# Patient Record
Sex: Female | Born: 1935 | Race: White | Hispanic: No | Marital: Married | Smoking: Never smoker
Health system: Southern US, Community
[De-identification: ages and names within clinical notes are randomized; demographics above are authoritative.]

## PROBLEM LIST (undated history)

## (undated) DIAGNOSIS — I4891 Unspecified atrial fibrillation: Secondary | ICD-10-CM

---

## 2005-12-03 ENCOUNTER — Other Ambulatory Visit: Payer: Self-pay

## 2005-12-03 ENCOUNTER — Emergency Department: Payer: Self-pay | Admitting: Emergency Medicine

## 2006-08-13 ENCOUNTER — Other Ambulatory Visit: Payer: Self-pay

## 2006-08-13 ENCOUNTER — Ambulatory Visit: Payer: Self-pay | Admitting: Podiatry

## 2006-08-17 ENCOUNTER — Ambulatory Visit: Payer: Self-pay | Admitting: Podiatry

## 2006-09-12 ENCOUNTER — Ambulatory Visit: Payer: Self-pay | Admitting: Nephrology

## 2007-05-06 ENCOUNTER — Ambulatory Visit: Payer: Self-pay | Admitting: Gastroenterology

## 2007-06-20 ENCOUNTER — Ambulatory Visit: Payer: Self-pay | Admitting: Cardiology

## 2007-12-21 ENCOUNTER — Ambulatory Visit: Payer: Self-pay | Admitting: Unknown Physician Specialty

## 2007-12-24 ENCOUNTER — Emergency Department: Payer: Self-pay | Admitting: Emergency Medicine

## 2008-01-19 ENCOUNTER — Emergency Department: Payer: Self-pay | Admitting: Emergency Medicine

## 2008-02-18 ENCOUNTER — Ambulatory Visit: Payer: Self-pay | Admitting: Pain Medicine

## 2008-02-26 ENCOUNTER — Ambulatory Visit: Payer: Self-pay | Admitting: Pain Medicine

## 2008-04-08 ENCOUNTER — Ambulatory Visit: Payer: Self-pay | Admitting: Internal Medicine

## 2008-04-21 ENCOUNTER — Ambulatory Visit: Payer: Self-pay | Admitting: Pain Medicine

## 2008-04-29 ENCOUNTER — Ambulatory Visit: Payer: Self-pay | Admitting: Pain Medicine

## 2008-06-02 ENCOUNTER — Ambulatory Visit: Payer: Self-pay | Admitting: Pain Medicine

## 2008-06-10 ENCOUNTER — Ambulatory Visit: Payer: Self-pay | Admitting: Pain Medicine

## 2008-07-10 ENCOUNTER — Emergency Department: Payer: Self-pay | Admitting: Emergency Medicine

## 2008-07-16 ENCOUNTER — Ambulatory Visit: Payer: Self-pay | Admitting: Pain Medicine

## 2008-07-29 ENCOUNTER — Ambulatory Visit: Payer: Self-pay | Admitting: Pain Medicine

## 2008-08-25 ENCOUNTER — Ambulatory Visit: Payer: Self-pay | Admitting: Pain Medicine

## 2008-10-15 ENCOUNTER — Ambulatory Visit: Payer: Self-pay | Admitting: Unknown Physician Specialty

## 2008-10-30 ENCOUNTER — Ambulatory Visit: Payer: Self-pay | Admitting: Unknown Physician Specialty

## 2009-09-02 IMAGING — CR DG THORACIC SPINE 2-3V
1 series · 2 of 2 positions shown · non-contrast
Comparison: none

REASON FOR EXAM: Pt fell tonight and injured mid back
COMMENTS:

[Series 1: view not recorded · 0.17mm/px · 2 of 2 slices shown]
[im 1/2]
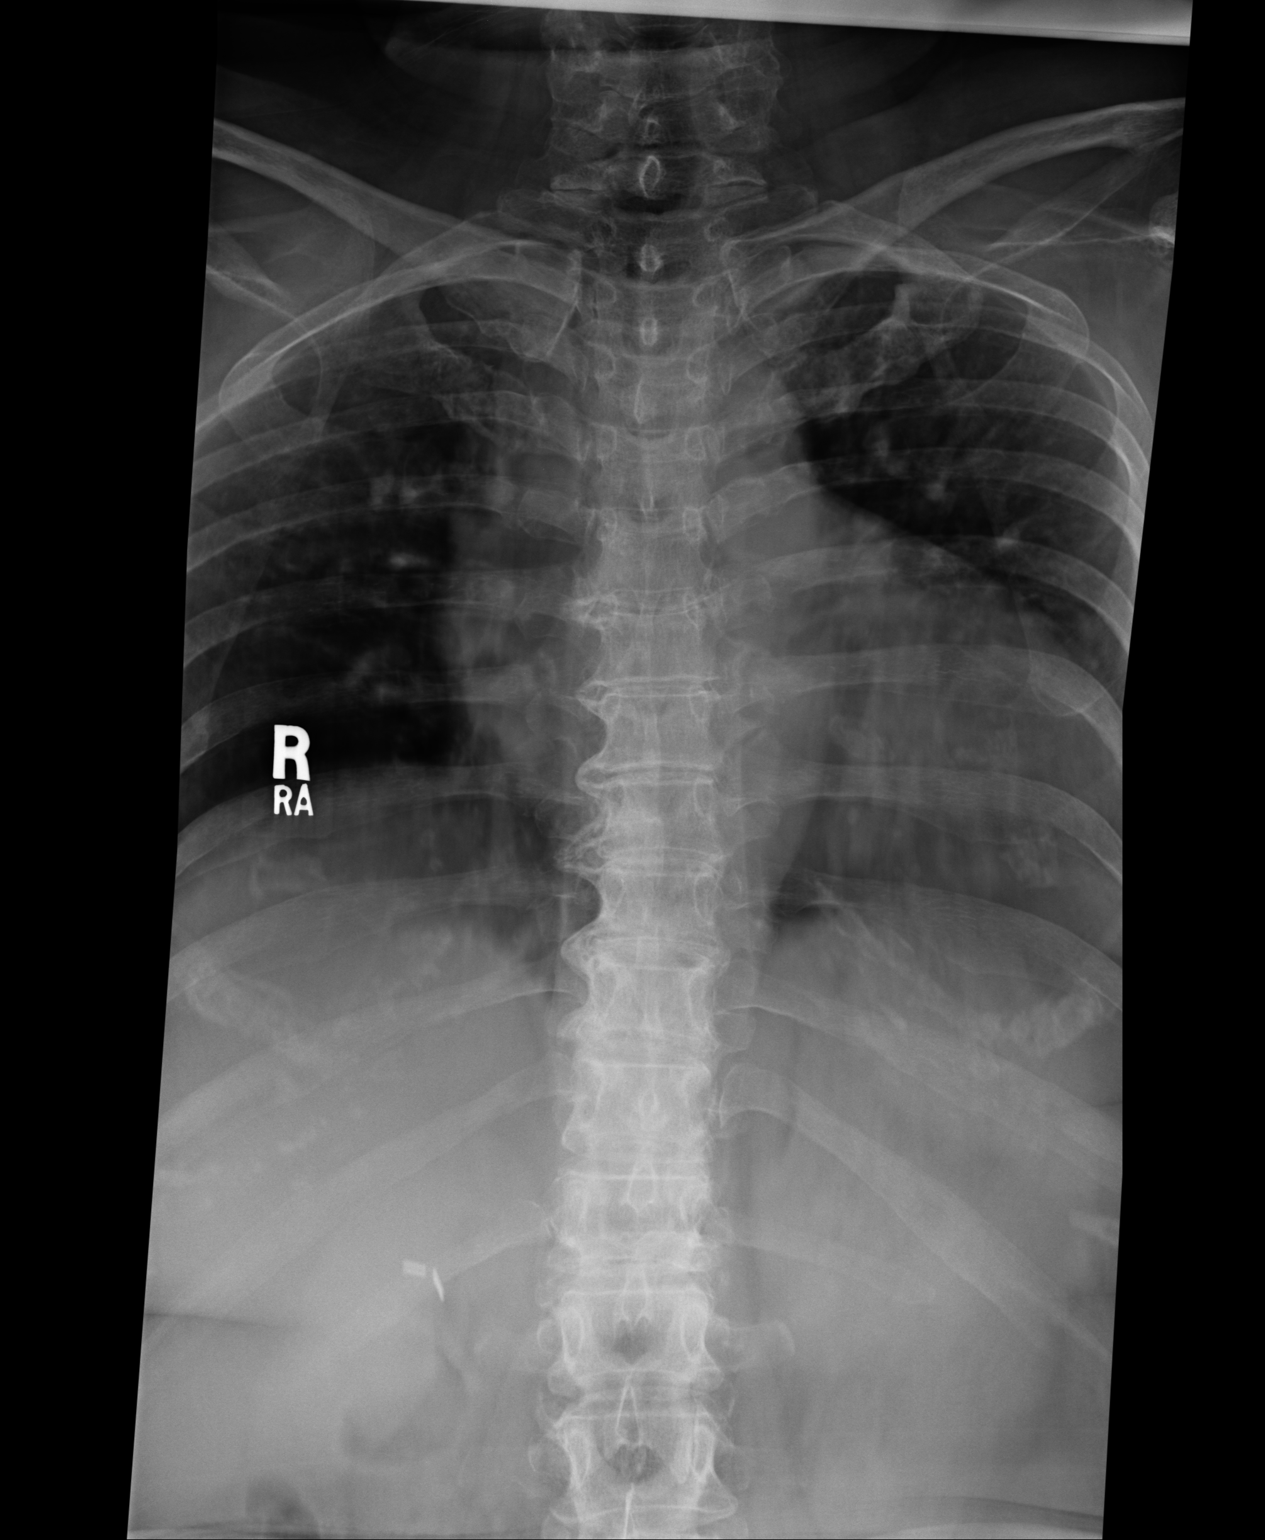
[im 2/2]
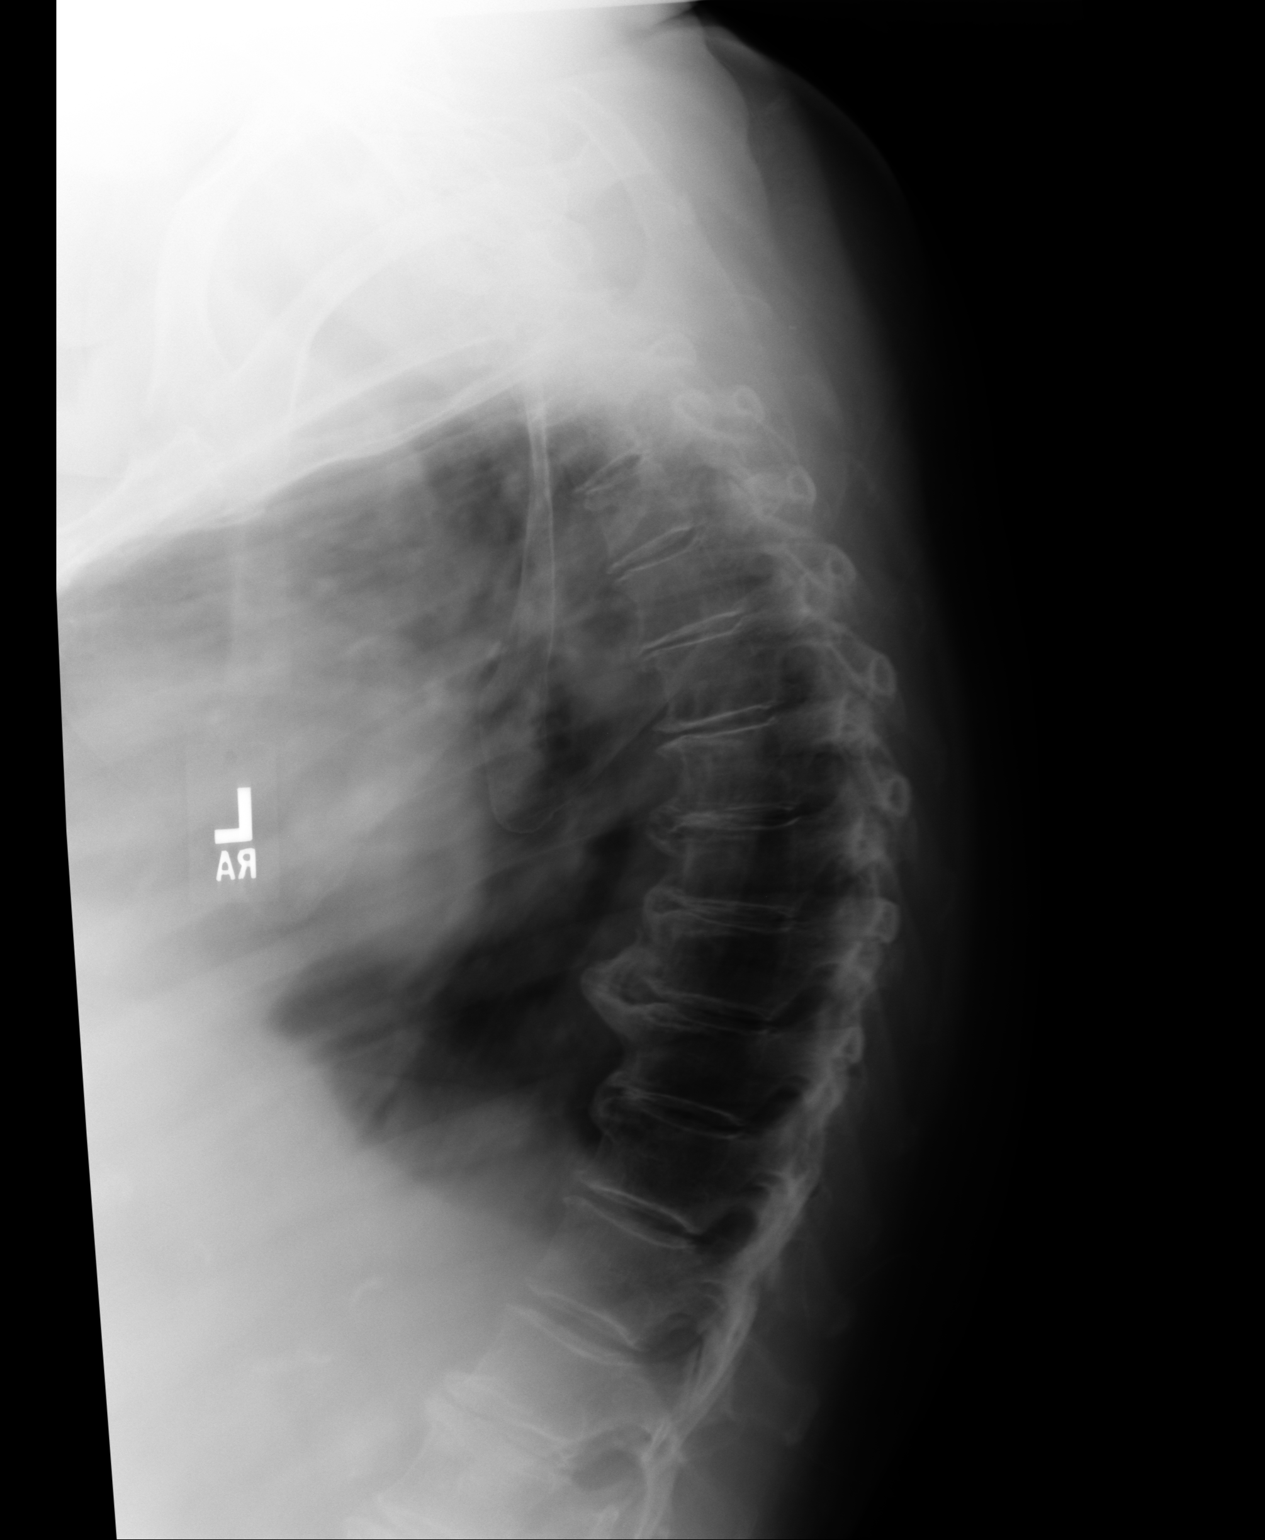

[2 of 2 positions shown; findings below may reference images not displayed]

PROCEDURE:     DXR - DXR THORACIC  AP AND LATERAL  - December 24, 2007 [DATE]

RESULT:     The vertebral body heights and the intervertebral disc spaces
are well maintained. No fracture is seen. Vertebral body alignment is
normal. Degenerative spurring is noted at multiples of the midthoracic
spine. The pedicles are bilateral intact.
IMPRESSION: 1. No acute changes are identified.
2. Degenerative spurring is present at multiple levels of the midthoracic
spine.

## 2009-10-31 ENCOUNTER — Emergency Department: Payer: Self-pay | Admitting: Emergency Medicine

## 2010-02-08 ENCOUNTER — Ambulatory Visit: Payer: Self-pay | Admitting: Internal Medicine

## 2010-03-08 ENCOUNTER — Ambulatory Visit: Payer: Self-pay | Admitting: Internal Medicine

## 2010-06-02 ENCOUNTER — Inpatient Hospital Stay: Payer: Self-pay | Admitting: Cardiology

## 2011-01-20 ENCOUNTER — Ambulatory Visit: Payer: Self-pay | Admitting: Gastroenterology

## 2011-01-23 LAB — PATHOLOGY REPORT

## 2011-12-13 ENCOUNTER — Ambulatory Visit: Payer: Self-pay | Admitting: Cardiology

## 2012-02-20 ENCOUNTER — Ambulatory Visit: Payer: Self-pay | Admitting: Orthopedic Surgery

## 2012-03-19 ENCOUNTER — Ambulatory Visit: Payer: Self-pay | Admitting: Orthopedic Surgery

## 2012-03-19 LAB — CBC
HCT: 42.3 % (ref 35.0–47.0)
MCH: 33.4 pg (ref 26.0–34.0)
MCHC: 34.3 g/dL (ref 32.0–36.0)
MCV: 98 fL (ref 80–100)
Platelet: 193 10*3/uL (ref 150–440)
RBC: 4.34 10*6/uL (ref 3.80–5.20)

## 2012-03-19 LAB — BASIC METABOLIC PANEL
Anion Gap: 9 (ref 7–16)
Chloride: 101 mmol/L (ref 98–107)
Co2: 31 mmol/L (ref 21–32)
Creatinine: 0.93 mg/dL (ref 0.60–1.30)
Glucose: 149 mg/dL — ABNORMAL HIGH (ref 65–99)
Sodium: 141 mmol/L (ref 136–145)

## 2012-03-28 ENCOUNTER — Inpatient Hospital Stay: Payer: Self-pay

## 2012-03-29 LAB — BASIC METABOLIC PANEL
Chloride: 100 mmol/L (ref 98–107)
Co2: 30 mmol/L (ref 21–32)
EGFR (African American): >60
EGFR (Non-African Amer.): >60
Potassium: 3.9 mmol/L (ref 3.5–5.1)
Sodium: 137 mmol/L (ref 136–145)

## 2012-03-29 LAB — PLATELET COUNT: Platelet: 174 10*3/uL (ref 150–440)

## 2012-03-30 LAB — CBC WITH DIFFERENTIAL/PLATELET
HCT: 42.3 % (ref 35.0–47.0)
Lymphocyte #: 2 10*3/uL (ref 1.0–3.6)
Lymphocyte %: 16.1 %
MCH: 33.1 pg (ref 26.0–34.0)
MCHC: 34.5 g/dL (ref 32.0–36.0)
Monocyte %: 11.7 %
Neutrophil #: 8.9 10*3/uL — ABNORMAL HIGH (ref 1.4–6.5)
RBC: 4.41 10*6/uL (ref 3.80–5.20)
RDW: 13 % (ref 11.5–14.5)
WBC: 12.4 10*3/uL — ABNORMAL HIGH (ref 3.6–11.0)

## 2012-03-30 LAB — COMPREHENSIVE METABOLIC PANEL
Albumin: 2.4 g/dL — ABNORMAL LOW (ref 3.4–5.0)
Anion Gap: 9 (ref 7–16)
BUN: 12 mg/dL (ref 7–18)
Bilirubin,Total: 0.5 mg/dL (ref 0.2–1.0)
Calcium, Total: 8.2 mg/dL — ABNORMAL LOW (ref 8.5–10.1)
Chloride: 96 mmol/L — ABNORMAL LOW (ref 98–107)
Creatinine: 0.71 mg/dL (ref 0.60–1.30)
Glucose: 169 mg/dL — ABNORMAL HIGH (ref 65–99)
SGPT (ALT): 29 U/L
Sodium: 133 mmol/L — ABNORMAL LOW (ref 136–145)

## 2012-03-30 LAB — PROTIME-INR: Prothrombin Time: 18.7 secs — ABNORMAL HIGH (ref 11.5–14.7)

## 2012-03-31 LAB — BASIC METABOLIC PANEL
Calcium, Total: 8.6 mg/dL (ref 8.5–10.1)
Chloride: 94 mmol/L — ABNORMAL LOW (ref 98–107)
Co2: 31 mmol/L (ref 21–32)
EGFR (African American): >60
EGFR (Non-African Amer.): >60
Potassium: 3.2 mmol/L — ABNORMAL LOW (ref 3.5–5.1)
Sodium: 133 mmol/L — ABNORMAL LOW (ref 136–145)

## 2012-03-31 LAB — CBC WITH DIFFERENTIAL/PLATELET
Basophil #: 0.1 10*3/uL (ref 0.0–0.1)
Basophil %: 0.4 %
Eosinophil #: 0.1 10*3/uL (ref 0.0–0.7)
Lymphocyte #: 1.8 10*3/uL (ref 1.0–3.6)
MCV: 95 fL (ref 80–100)
Monocyte #: 1.3 x10 3/mm — ABNORMAL HIGH (ref 0.2–0.9)
Monocyte %: 10.6 %
Neutrophil #: 9.3 10*3/uL — ABNORMAL HIGH (ref 1.4–6.5)
Neutrophil %: 73.8 %
RBC: 4.4 10*6/uL (ref 3.80–5.20)

## 2012-04-01 LAB — BASIC METABOLIC PANEL
BUN: 14 mg/dL (ref 7–18)
Calcium, Total: 8.2 mg/dL — ABNORMAL LOW (ref 8.5–10.1)
EGFR (Non-African Amer.): >60

## 2012-04-02 ENCOUNTER — Encounter: Payer: Self-pay | Admitting: Internal Medicine

## 2012-04-02 LAB — BASIC METABOLIC PANEL
Anion Gap: 9 (ref 7–16)
BUN: 15 mg/dL (ref 7–18)
Calcium, Total: 8.6 mg/dL (ref 8.5–10.1)
Chloride: 100 mmol/L (ref 98–107)
Co2: 27 mmol/L (ref 21–32)
Creatinine: 0.81 mg/dL (ref 0.60–1.30)
EGFR (African American): >60
Osmolality: 276 (ref 275–301)

## 2012-04-02 LAB — PROTIME-INR: INR: 2.7

## 2012-04-04 LAB — PROTIME-INR
INR: 1.9
Prothrombin Time: 21.8 secs — ABNORMAL HIGH (ref 11.5–14.7)

## 2012-04-11 LAB — PROTIME-INR
INR: 1.9
Prothrombin Time: 22.2 secs — ABNORMAL HIGH (ref 11.5–14.7)

## 2012-04-13 ENCOUNTER — Encounter: Payer: Self-pay | Admitting: Internal Medicine

## 2012-04-16 LAB — PROTIME-INR: INR: 1.9

## 2012-07-10 ENCOUNTER — Ambulatory Visit (INDEPENDENT_AMBULATORY_CARE_PROVIDER_SITE_OTHER): Payer: BC Managed Care – PPO | Admitting: Sports Medicine

## 2012-07-10 ENCOUNTER — Other Ambulatory Visit: Payer: Self-pay | Admitting: Radiology

## 2012-07-10 ENCOUNTER — Encounter: Payer: Self-pay | Admitting: Sports Medicine

## 2012-07-10 VITALS — BP 116/69 | HR 66 | Ht 60.5 in | Wt 225.0 lb

## 2012-07-10 DIAGNOSIS — M7120 Synovial cyst of popliteal space [Baker], unspecified knee: Secondary | ICD-10-CM

## 2012-07-10 NOTE — Progress Notes (Signed)
Patient is a very pleasant 78 year old female who is here 3 months status post left total knee replacement to be evaluated for ongoing Baker's cyst. Patient was seen previously and had her surgery done by Dr. Rosita Kea of [facility]. Patient states that the surgery went very well and has been doing the physical therapy which she has made great progress. 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 Patient states that this Baker's cyst seems to be painful and hurts her with flexion and she says this is inhibiting her rehabilitation. Patient was sent here for evaluation for potential aspiration. Patient denies any locking or popping of the knee, denies any numbness of the extremity, denies any fevers or chills or any redness out of the ordinary of the knee.  Current outpatient prescriptions:acetaminophen (TYLENOL) 500 MG tablet, Take 500 mg by mouth every 6 (six) hours as needed., Disp: , Rfl: ;  allopurinol (ZYLOPRIM) 100 MG tablet, Take 100 mg by mouth daily., Disp: , Rfl: ;  amLODipine (NORVASC) 10 MG tablet, Take 5 mg by mouth daily., Disp: , Rfl: ;  Calcium Carbonate-Vitamin D (CALCIUM + D PO), Take by mouth., Disp: , Rfl:  cholecalciferol (VITAMIN D) 1000 UNITS tablet, Take 1,000 Units by mouth daily., Disp: , Rfl: ;  cloNIDine (CATAPRES) 0.2 MG tablet, Take 0.2 mg by mouth 2 (two) times daily., Disp: , Rfl: ;  fenofibrate 160 MG tablet, Take 160 mg by mouth daily., Disp: , Rfl: ;  fish oil-omega-3 fatty acids 1000 MG capsule, Take 2 g by mouth daily., Disp: , Rfl: ;  hydrochlorothiazide (HYDRODIURIL) 25 MG tablet, Take 25 mg by mouth daily., Disp: , Rfl:  losartan-hydrochlorothiazide (HYZAAR) 100-12.5 MG per tablet, Take 1 tablet by mouth daily., Disp: , Rfl: ;  Melatonin 5 MG TABS, Take 1 tablet by mouth daily., Disp: , Rfl: ;  metFORMIN (GLUCOPHAGE) 500 MG tablet, Take 500 mg by mouth 2 (two) times  daily., Disp: , Rfl: ;  Multiple Vitamin (MULTIVITAMIN) tablet, Take 1 tablet by mouth daily., Disp: , Rfl:  pantoprazole (PROTONIX) 40 MG tablet, Take 40 mg by mouth 2 (two) times daily., Disp: , Rfl: ;  simvastatin (ZOCOR) 40 MG tablet, Take 40 mg by mouth daily., Disp: , Rfl: ;  sotalol (BETAPACE) 80 MG tablet, Take 80 mg by mouth 2 (two) times daily., Disp: , Rfl: ;  venlafaxine XR (EFFEXOR-XR) 75 MG 24 hr capsule, Take 75 mg by mouth daily., Disp: , Rfl: ;  vitamin B-12 (CYANOCOBALAMIN) 1000 MCG tablet, Take 1,000 mcg by mouth daily., Disp: , Rfl:   History   Social History  . Marital Status: Married    Spouse Name: N/A    Number of Children: N/A  . Years of Education: N/A   Occupational History  . Not on file.   Social History Main Topics  . Smoking status: Never Smoker   . Smokeless tobacco: Never Used  . Alcohol Use: Not on file  . Drug Use: Not on file  . Sexually Active: Not on file   Other Topics Concern  . Not on file   Social History Narrative  . No narrative on file   Allergies  Allergen Reactions  . Amitriptyline   . Celebrex (Celecoxib)   . Codeine Nausea Only  . Colchicine Diarrhea  . Sulfa Antibiotics Nausea Only  . Vioxx (Rofecoxib) Nausea Only   Physical exam Filed Vitals:  07/10/12 1008  BP: 116/69  Pulse: 66   General: no apparent distress very pleasant elderly woman Alert and oriented x3 patient's mood and affect are normal Respiratory: Patient is able to speak in full sentences and does not appear short of breath Skin: Clean dry and intact no signs of erythema or cellulitis. Left knee exam: On inspection patient's incision appears to be well healing on the anterior aspect of the knee. Patient has full extension in the 100 of flexion. Patient has some pain in the posterior aspect of the knee with 90 of flexion. Patient has no joint line tenderness. When palpating posteriorly patient does have a Baker's cyst that is palpated approximately 1-2 cm  in diameter, minor fluctuation. The location of this is a little bit more lateral than most Baker's cyst. Neurovascularly intact distally. No edema distally.  Ultrasound was done and interpreted by me today. On limited knee imaging concentrating on the posterior aspect of knee patient does have a Baker cyst that measures less than one centimeters in diameter mostly just lateral to the popliteal fossa superior to the gastroc head. Patient has overlying popliteal vessels that are in close proximity to the cyst itself.

## 2012-07-10 NOTE — Patient Instructions (Addendum)
Very nice to meet you both We will get this schedule with interventional radiology for aspiration of your Baker cyst.  They will call you and get the procedure scheduled.  Good luck with anything and if you have any questions or concerns please give [place] a call.

## 2012-07-10 NOTE — Assessment & Plan Note (Addendum)
Patient does have a Baker's cyst on the posterior aspect of her knee that likely is causing some of the pain with her flexion. Due to the close proximity of the great vessels in this area did not feel comfortable doing an aspiration of this cyst. We did discuss care of patient with her primary orthopedic physician Dr. Rosita Kea and he felt comfortable that if interventional radiology could aspirate this Baker's cyst that that would be fine, even being 3 months out from TKR. At this time we have referred her to interventional radiology where she will have ultrasound guided aspiration of her Baker's cyst if they feel comfortable after evaluation. Patient has an appointment set up for this Friday at 12:45 PM. Interventional radiology will be calling her with this information. Patient then can either followup with [place] or with Dr. Rosita Kea for any more for problems.

## 2012-07-12 ENCOUNTER — Other Ambulatory Visit: Payer: Self-pay | Admitting: Family Medicine

## 2012-07-12 ENCOUNTER — Ambulatory Visit (HOSPITAL_COMMUNITY)
Admission: RE | Admit: 2012-07-12 | Discharge: 2012-07-12 | Disposition: A | Discharge status: Home / Self Care | Payer: BC Managed Care – PPO | Source: Ambulatory Visit | Attending: Sports Medicine | Admitting: Sports Medicine

## 2012-07-12 DIAGNOSIS — M7120 Synovial cyst of popliteal space [Baker], unspecified knee: Secondary | ICD-10-CM

## 2012-07-12 DIAGNOSIS — M25569 Pain in unspecified knee: Secondary | ICD-10-CM | POA: Insufficient documentation

## 2013-03-07 ENCOUNTER — Ambulatory Visit: Payer: Self-pay | Admitting: Internal Medicine

## 2013-06-26 ENCOUNTER — Inpatient Hospital Stay (HOSPITAL_COMMUNITY): Payer: Medicare Other

## 2013-06-26 ENCOUNTER — Ambulatory Visit: Admit: 2013-06-26 | Payer: Self-pay | Admitting: Interventional Radiology

## 2013-06-26 ENCOUNTER — Inpatient Hospital Stay (HOSPITAL_COMMUNITY): Payer: Medicare Other | Admitting: Anesthesiology

## 2013-06-26 ENCOUNTER — Encounter (HOSPITAL_COMMUNITY): Payer: Self-pay | Admitting: Neurology

## 2013-06-26 ENCOUNTER — Encounter (HOSPITAL_COMMUNITY): Payer: Self-pay | Admitting: Anesthesiology

## 2013-06-26 ENCOUNTER — Emergency Department (HOSPITAL_COMMUNITY): Payer: Medicare Other

## 2013-06-26 ENCOUNTER — Encounter (HOSPITAL_COMMUNITY): Admission: EM | Disposition: E | Payer: Self-pay | Source: Home / Self Care | Attending: Neurology

## 2013-06-26 ENCOUNTER — Inpatient Hospital Stay (HOSPITAL_COMMUNITY)
Admission: EM | Admit: 2013-06-26 | Discharge: 2013-07-14 | DRG: 023 | Disposition: E | Payer: Medicare Other | Attending: Neurology | Admitting: Neurology

## 2013-06-26 DIAGNOSIS — G8190 Hemiplegia, unspecified affecting unspecified side: Secondary | ICD-10-CM | POA: Diagnosis present

## 2013-06-26 DIAGNOSIS — R4182 Altered mental status, unspecified: Secondary | ICD-10-CM | POA: Diagnosis present

## 2013-06-26 DIAGNOSIS — I6350 Cerebral infarction due to unspecified occlusion or stenosis of unspecified cerebral artery: Secondary | ICD-10-CM

## 2013-06-26 DIAGNOSIS — I639 Cerebral infarction, unspecified: Secondary | ICD-10-CM

## 2013-06-26 DIAGNOSIS — I63511 Cerebral infarction due to unspecified occlusion or stenosis of right middle cerebral artery: Secondary | ICD-10-CM | POA: Diagnosis present

## 2013-06-26 DIAGNOSIS — Z7901 Long term (current) use of anticoagulants: Secondary | ICD-10-CM

## 2013-06-26 DIAGNOSIS — I10 Essential (primary) hypertension: Secondary | ICD-10-CM | POA: Diagnosis present

## 2013-06-26 DIAGNOSIS — Z882 Allergy status to sulfonamides status: Secondary | ICD-10-CM

## 2013-06-26 DIAGNOSIS — E876 Hypokalemia: Secondary | ICD-10-CM | POA: Diagnosis present

## 2013-06-26 DIAGNOSIS — I6529 Occlusion and stenosis of unspecified carotid artery: Secondary | ICD-10-CM | POA: Diagnosis present

## 2013-06-26 DIAGNOSIS — E669 Obesity, unspecified: Secondary | ICD-10-CM | POA: Diagnosis present

## 2013-06-26 DIAGNOSIS — G936 Cerebral edema: Secondary | ICD-10-CM | POA: Diagnosis not present

## 2013-06-26 DIAGNOSIS — Z515 Encounter for palliative care: Secondary | ICD-10-CM

## 2013-06-26 DIAGNOSIS — J9600 Acute respiratory failure, unspecified whether with hypoxia or hypercapnia: Secondary | ICD-10-CM | POA: Diagnosis not present

## 2013-06-26 DIAGNOSIS — I619 Nontraumatic intracerebral hemorrhage, unspecified: Secondary | ICD-10-CM | POA: Diagnosis not present

## 2013-06-26 DIAGNOSIS — I4891 Unspecified atrial fibrillation: Secondary | ICD-10-CM | POA: Diagnosis present

## 2013-06-26 DIAGNOSIS — I6340 Cerebral infarction due to embolism of unspecified cerebral artery: Principal | ICD-10-CM | POA: Diagnosis present

## 2013-06-26 DIAGNOSIS — Z66 Do not resuscitate: Secondary | ICD-10-CM | POA: Diagnosis present

## 2013-06-26 DIAGNOSIS — R4020 Unspecified coma: Secondary | ICD-10-CM | POA: Diagnosis present

## 2013-06-26 DIAGNOSIS — G935 Compression of brain: Secondary | ICD-10-CM | POA: Diagnosis not present

## 2013-06-26 DIAGNOSIS — E785 Hyperlipidemia, unspecified: Secondary | ICD-10-CM | POA: Diagnosis present

## 2013-06-26 HISTORY — DX: Unspecified atrial fibrillation: I48.91

## 2013-06-26 HISTORY — PX: RADIOLOGY WITH ANESTHESIA: SHX6223

## 2013-06-26 LAB — POCT I-STAT 3, ART BLOOD GAS (G3+)
Bicarbonate: 26.6 mEq/L — ABNORMAL HIGH (ref 20.0–24.0)
Patient temperature: 36.5
pH, Arterial: 7.321 — ABNORMAL LOW (ref 7.350–7.450)

## 2013-06-26 LAB — URINALYSIS, ROUTINE W REFLEX MICROSCOPIC
Glucose, UA: NEGATIVE mg/dL
Protein, ur: >300 mg/dL — AB

## 2013-06-26 LAB — CBC
HCT: 44.2 % (ref 36.0–46.0)
Hemoglobin: 15.4 g/dL — ABNORMAL HIGH (ref 12.0–15.0)
MCH: 33.5 pg (ref 26.0–34.0)
MCHC: 34.8 g/dL (ref 30.0–36.0)
MCV: 96.1 fL (ref 78.0–100.0)

## 2013-06-26 LAB — ETHANOL: Alcohol, Ethyl (B): <11 mg/dL (ref 0–11)

## 2013-06-26 LAB — URINE MICROSCOPIC-ADD ON

## 2013-06-26 LAB — COMPREHENSIVE METABOLIC PANEL
Albumin: 3.3 g/dL — ABNORMAL LOW (ref 3.5–5.2)
BUN: 29 mg/dL — ABNORMAL HIGH (ref 6–23)
Calcium: 10 mg/dL (ref 8.4–10.5)
Chloride: 98 mEq/L (ref 96–112)
Creatinine, Ser: 0.8 mg/dL (ref 0.50–1.10)
Total Bilirubin: 0.2 mg/dL — ABNORMAL LOW (ref 0.3–1.2)
Total Protein: 7.2 g/dL (ref 6.0–8.3)

## 2013-06-26 LAB — RAPID URINE DRUG SCREEN, HOSP PERFORMED
Amphetamines: NOT DETECTED
Opiates: NOT DETECTED

## 2013-06-26 LAB — ABO/RH: ABO/RH(D): AB POS

## 2013-06-26 LAB — POCT ACTIVATED CLOTTING TIME
Activated Clotting Time: 160 seconds
Activated Clotting Time: 211 seconds

## 2013-06-26 LAB — POCT I-STAT, CHEM 8
BUN: 29 mg/dL — ABNORMAL HIGH (ref 6–23)
Creatinine, Ser: 1 mg/dL (ref 0.50–1.10)
Potassium: 3.7 mEq/L (ref 3.5–5.1)
Sodium: 141 mEq/L (ref 135–145)

## 2013-06-26 LAB — TROPONIN I: Troponin I: <0.3 ng/mL (ref <0.30)

## 2013-06-26 LAB — POCT I-STAT TROPONIN I

## 2013-06-26 LAB — DIFFERENTIAL
Basophils Relative: 0 % (ref 0–1)
Eosinophils Absolute: 0.2 10*3/uL (ref 0.0–0.7)
Eosinophils Relative: 2 % (ref 0–5)
Monocytes Absolute: 0.6 10*3/uL (ref 0.1–1.0)
Monocytes Relative: 6 % (ref 3–12)

## 2013-06-26 LAB — SODIUM: Sodium: 140 mEq/L (ref 135–145)

## 2013-06-26 LAB — MRSA PCR SCREENING: MRSA by PCR: NEGATIVE

## 2013-06-26 SURGERY — RADIOLOGY WITH ANESTHESIA
Anesthesia: General

## 2013-06-26 MED ORDER — NICARDIPINE HCL IN NACL 20-0.86 MG/200ML-% IV SOLN
5.0000 mg/h | INTRAVENOUS | Status: AC
  Administered 2013-06-26 (×2): 15 mg/h via INTRAVENOUS
  Administered 2013-06-26: 5 mg/h via INTRAVENOUS
  Administered 2013-06-26 (×3): 15 mg/h via INTRAVENOUS
  Administered 2013-06-27: 10 mg/h via INTRAVENOUS
  Administered 2013-06-27 (×5): 15 mg/h via INTRAVENOUS
  Filled 2013-06-26 (×13): qty 200

## 2013-06-26 MED ORDER — LIDOCAINE HCL (CARDIAC) 20 MG/ML IV SOLN
INTRAVENOUS | Status: DC | PRN
  Administered 2013-06-26: 80 mg via INTRAVENOUS

## 2013-06-26 MED ORDER — IOHEXOL 300 MG/ML  SOLN
150.0000 mL | Freq: Once | INTRAMUSCULAR | Status: AC | PRN
  Administered 2013-06-26: 130 mL via INTRA_ARTERIAL

## 2013-06-26 MED ORDER — SODIUM CHLORIDE 0.9 % IV SOLN
10.0000 mg | INTRAVENOUS | Status: DC | PRN
  Administered 2013-06-26: 25 ug/min via INTRAVENOUS

## 2013-06-26 MED ORDER — ROCURONIUM BROMIDE 100 MG/10ML IV SOLN
INTRAVENOUS | Status: DC | PRN
  Administered 2013-06-26: 50 mg via INTRAVENOUS

## 2013-06-26 MED ORDER — ROCURONIUM BROMIDE 50 MG/5ML IV SOLN
INTRAVENOUS | Status: AC
  Filled 2013-06-26: qty 2

## 2013-06-26 MED ORDER — CHLORHEXIDINE GLUCONATE 0.12 % MT SOLN
15.0000 mL | Freq: Two times a day (BID) | OROMUCOSAL | Status: DC
  Administered 2013-06-27: 15 mL via OROMUCOSAL

## 2013-06-26 MED ORDER — LABETALOL HCL 5 MG/ML IV SOLN
10.0000 mg | INTRAVENOUS | Status: DC | PRN
  Administered 2013-06-26 – 2013-06-27 (×2): 10 mg via INTRAVENOUS
  Filled 2013-06-26: qty 4

## 2013-06-26 MED ORDER — CEFAZOLIN SODIUM-DEXTROSE 2-3 GM-% IV SOLR
INTRAVENOUS | Status: AC
  Filled 2013-06-26: qty 50

## 2013-06-26 MED ORDER — SUCCINYLCHOLINE CHLORIDE 20 MG/ML IJ SOLN
INTRAMUSCULAR | Status: DC | PRN
  Administered 2013-06-26: 100 mg via INTRAVENOUS

## 2013-06-26 MED ORDER — SODIUM CHLORIDE 0.9 % IV SOLN
INTRAVENOUS | Status: DC | PRN
  Administered 2013-06-26: 12:00:00 via INTRAVENOUS

## 2013-06-26 MED ORDER — MIDAZOLAM HCL 2 MG/2ML IJ SOLN
INTRAMUSCULAR | Status: AC
  Filled 2013-06-26: qty 6

## 2013-06-26 MED ORDER — PROPOFOL 10 MG/ML IV BOLUS
INTRAVENOUS | Status: DC | PRN
  Administered 2013-06-26: 110 mg via INTRAVENOUS

## 2013-06-26 MED ORDER — ETOMIDATE 2 MG/ML IV SOLN
INTRAVENOUS | Status: AC
  Filled 2013-06-26: qty 20

## 2013-06-26 MED ORDER — ACETAMINOPHEN 500 MG PO TABS: 1000.0000 mg | ORAL_TABLET | Freq: Four times a day (QID) | ORAL | Status: DC | PRN

## 2013-06-26 MED ORDER — SENNOSIDES-DOCUSATE SODIUM 8.6-50 MG PO TABS
1.0000 | ORAL_TABLET | Freq: Every evening | ORAL | Status: DC | PRN
Start: 2013-06-26 — End: 2013-06-27

## 2013-06-26 MED ORDER — ACETAMINOPHEN 650 MG RE SUPP: 650.0000 mg | Freq: Four times a day (QID) | RECTAL | Status: DC | PRN

## 2013-06-26 MED ORDER — LIDOCAINE HCL (CARDIAC) 20 MG/ML IV SOLN
INTRAVENOUS | Status: AC
  Filled 2013-06-26: qty 5

## 2013-06-26 MED ORDER — HEPARIN SODIUM (PORCINE) 1000 UNIT/ML IJ SOLN
INTRAMUSCULAR | Status: DC | PRN
  Administered 2013-06-26 (×2): 1000 [IU] via INTRAVENOUS

## 2013-06-26 MED ORDER — ONDANSETRON HCL 4 MG/2ML IJ SOLN
4.0000 mg | Freq: Four times a day (QID) | INTRAMUSCULAR | Status: DC | PRN
  Administered 2013-06-27: 4 mg via INTRAVENOUS
  Filled 2013-06-26: qty 2

## 2013-06-26 MED ORDER — FENTANYL CITRATE 0.05 MG/ML IJ SOLN
INTRAMUSCULAR | Status: AC
  Filled 2013-06-26: qty 6

## 2013-06-26 MED ORDER — CEFAZOLIN SODIUM-DEXTROSE 2-3 GM-% IV SOLR
2.0000 g | Freq: Once | INTRAVENOUS | Status: AC
  Administered 2013-06-26: 2 g via INTRAVENOUS

## 2013-06-26 MED ORDER — PROTAMINE SULFATE 10 MG/ML IV SOLN
INTRAVENOUS | Status: DC | PRN
  Administered 2013-06-26: 10 mg via INTRAVENOUS

## 2013-06-26 MED ORDER — CHLORHEXIDINE GLUCONATE 0.12 % MT SOLN
OROMUCOSAL | Status: AC
  Administered 2013-06-26: 15 mL
  Filled 2013-06-26: qty 15

## 2013-06-26 MED ORDER — FENTANYL CITRATE 0.05 MG/ML IJ SOLN: 25.0000 ug | INTRAMUSCULAR | Status: DC | PRN

## 2013-06-26 MED ORDER — PROPOFOL 10 MG/ML IV EMUL
5.0000 ug/kg/min | INTRAVENOUS | Status: DC
  Administered 2013-06-26: 10 ug/kg/min via INTRAVENOUS
  Filled 2013-06-26: qty 100

## 2013-06-26 MED ORDER — FENTANYL CITRATE 0.05 MG/ML IJ SOLN
INTRAMUSCULAR | Status: DC | PRN
  Administered 2013-06-26: 50 ug via INTRAVENOUS
  Administered 2013-06-26: 100 ug via INTRAVENOUS
  Administered 2013-06-26: 250 ug via INTRAVENOUS
  Administered 2013-06-26: 100 ug via INTRAVENOUS

## 2013-06-26 MED ORDER — BIOTENE DRY MOUTH MT LIQD
15.0000 mL | Freq: Four times a day (QID) | OROMUCOSAL | Status: DC
  Administered 2013-06-26 – 2013-06-27 (×2): 15 mL via OROMUCOSAL

## 2013-06-26 MED ORDER — SODIUM CHLORIDE 3 % IV SOLN
INTRAVENOUS | Status: DC
  Administered 2013-06-26 – 2013-06-27 (×2): via INTRAVENOUS
  Filled 2013-06-26 (×6): qty 500

## 2013-06-26 MED ORDER — SODIUM CHLORIDE 0.9 % IV SOLN
INTRAVENOUS | Status: DC
  Administered 2013-06-26: 15:00:00 via INTRAVENOUS

## 2013-06-26 MED ORDER — NITROGLYCERIN 1 MG/10 ML FOR IR/CATH LAB
INTRA_ARTERIAL | Status: AC
  Filled 2013-06-26: qty 10

## 2013-06-26 MED ORDER — SUCCINYLCHOLINE CHLORIDE 20 MG/ML IJ SOLN
INTRAMUSCULAR | Status: AC
  Filled 2013-06-26: qty 1

## 2013-06-26 NOTE — Transfer of Care (Signed)
Immediate Anesthesia Transfer of Care Note  Patient: Tara [name]  Procedure(s) Performed: Procedure(s): RADIOLOGY WITH ANESTHESIA (N/A)  Patient Location: ICU  Anesthesia Type:General  Level of Consciousness: sedated and unresponsive  Airway & Oxygen Therapy: Patient remains intubated per anesthesia plan and Patient placed on Ventilator (see vital sign flow sheet for setting)  Post-op Assessment: Post -op Vital signs reviewed and stable  Post vital signs: Reviewed and stable  Complications: No apparent anesthesia complications

## 2013-06-26 NOTE — Procedures (Signed)
S/P RT common carotid arteriogram followed by by complete angiograhic recanalization of occluded RT ICA,RT MCA and RT ACA using 2 passes with the trevoprovue device.. Isolated rt cerebral cerebral   hemisphere  with no collaterals.

## 2013-06-26 NOTE — Progress Notes (Signed)
Right groin stick time was 1210 pm; revascularization time was 1255 pm

## 2013-06-26 NOTE — Progress Notes (Signed)
Responded to code stroke  page to escort pt. family to consultation room(A).  Pt reported to ED with altered mental status after being found unresponsive by daughter slumped over in chair. I remained with family until the physician debriefed them.  Pt. was later taken to interventional Radiology for procedure.  Pt. Is expected to be admitted to [facility]. I provided prayer, words of comfort, listening, ministry of presence , information sharing between staff and family.anticipatory grief counseling. Family moved to radiology waiting per doctors' request. Will follow as needed.  06/17/2013 1200  Clinical Encounter Type  Visited With Patient;Family;Health care provider  Visit Type Spiritual support;Patient in surgery;Critical Care;ED  Referral From Nurse  Spiritual Encounters  Spiritual Needs Prayer;Emotional  Stress Factors  Family Stress Factors Exhausted;Health changes;Major life changes  Tara [name], [address]

## 2013-06-26 NOTE — ED Notes (Signed)
Care transferred over to anesthesia

## 2013-06-26 NOTE — Anesthesia Preprocedure Evaluation (Addendum)
Anesthesia Evaluation  Patient identified by MRN, date of birth, ID band Patient unresponsive    Reviewed: Allergy & Precautions, H&P , Patient's Chart, lab work & pertinent test results  Airway  TM Distance: <3 FB    Comment: Unable to fully evaluate.  Pt is unresponsive. Dental   Pulmonary          Cardiovascular hypertension, + dysrhythmias Atrial Fibrillation     Neuro/Psych Pt is a code stroke.  Acute CVA CVA, Residual Symptoms    GI/Hepatic   Endo/Other  diabetes, Type 2  Renal/GU      Musculoskeletal   Abdominal   Peds  Hematology   Anesthesia Other Findings   Reproductive/Obstetrics                          Anesthesia Physical Anesthesia Plan  ASA: IV and emergent  Anesthesia Plan: General   Post-op Pain Management:    Induction: Intravenous  Airway Management Planned: Oral ETT  Additional Equipment:   Intra-op Plan:   Post-operative Plan: Post-operative intubation/ventilation  Informed Consent:   Plan Discussed with: CRNA, Anesthesiologist and Surgeon  Anesthesia Plan Comments:         Anesthesia Quick Evaluation

## 2013-06-26 NOTE — Progress Notes (Signed)
MD paged due to patients neuro changes, Pupils unequal Right pupil 5, Left pupil 3 and both non-reactive. Pt is withdrawing to pain in all extremities except the left upper. Orders received for stat head CT. Head Ct completed and MD called to look at results. Orders received for further treatment of central line placement and 3% saline to be started.

## 2013-06-26 NOTE — Anesthesia Postprocedure Evaluation (Signed)
  Anesthesia Post-op Note  Patient: Tara [name]  Procedure(s) Performed: Procedure(s): RADIOLOGY WITH ANESTHESIA (N/A)  Patient Location: ICU  Anesthesia Type:General  Level of Consciousness: sedated and unresponsive  Airway and Oxygen Therapy: Patient remains intubated per anesthesia plan and Patient placed on Ventilator (see vital sign flow sheet for setting)  Post-op Pain: none  Post-op Assessment: Post-op Vital signs reviewed, Patient's Cardiovascular Status Stable, Respiratory Function Stable and Patent Airway  Post-op Vital Signs: Reviewed and stable  Complications: No apparent anesthesia complications

## 2013-06-26 NOTE — ED Notes (Addendum)
Verbal order from julie-rn to place temp foley.  Sterile technique used throughout procedure.  Urine yellow in color.  Slightly cloudy.  Patient tolerated well.  Nothing abnormal to report.

## 2013-06-26 NOTE — Progress Notes (Signed)
At bedside rounds, it was discovered that Tara [name] pupils were unequal, L3 R4. Stroke MD paged. She also is now withdrawing on left and following commands on the rt, which is an improvement.

## 2013-06-26 NOTE — Procedures (Signed)
Central Venous Catheter Insertion Procedure Note Evanthia Maund [id number] 07-30-1932  Procedure: Insertion of Central Venous Catheter Indications: Assessment of intravascular volume and Drug and/or fluid administration  Procedure Details Consent: Risks of procedure as well as the alternatives and risks of each were explained to the (patient/caregiver).  Consent for procedure obtained. Time Out: Verified patient identification, verified procedure, site/side was marked, verified correct patient position, special equipment/implants available, medications/allergies/relevent history reviewed, required imaging and test results available.  Performed  Maximum sterile technique was used including antiseptics, cap, gloves, gown, hand hygiene, mask and sheet. Skin prep: Chlorhexidine; local anesthetic administered A antimicrobial bonded/coated triple lumen catheter was placed in the right internal jugular vein using the Seldinger technique.  Ultrasound was used to verify the patency of the vein and for real time needle guidance.  Evaluation Blood flow good Complications: No apparent complications Patient did tolerate procedure well. Chest X-ray ordered to verify placement.  CXR: pending.  Luciano Cinquemani 06/13/2013, 10:26 PM

## 2013-06-26 NOTE — Consult Note (Signed)
PULMONARY  / CRITICAL CARE MEDICINE  Name: Tara [name] MRN: [id number] DOB: 09-17-1932    ADMISSION DATE:  06/30/2013 CONSULTATION DATE: 07/09/2013  REFERRING MD :   PRIMARY SERVICE:   CHIEF COMPLAINT:  Code stroke / unresponsiveness, left hemiparesis   BRIEF PATIENT DESCRIPTION: Tara [name] is a 89 yof with pmh significant for a fib on coumadin, brought to [facility] ED on 06/27/2013  with AMS and left hemiparesis. Found to have right MCA stroke, to IR for clot retrieval and admitted to Neuro ICU on vent.   SIGNIFICANT EVENTS / STUDIES:  8/14 : Normal mentation at 6:20 AM 8/14, daughter found pt a few hours later unresponsive with left hemiparesis  8/14: CT brain showed hyperdense R MCA sign and early hypodense area involving approx less than 1/3 R MCA territory.   LINES / TUBES: 8/14: L Radial A line >>> 8/14: R Femoral A line >>> 8/14: NG/OG Tube >>>  CULTURES: 8/14 MRSA >>>   ANTIBIOTICS: 8/14 Ancef Periop  HISTORY OF PRESENT ILLNESS:  Tara [name] is a 74 yof with pmh significant for a fib on coumadin, brought to [facility] ED on 07/08/2013  with AMS and left hemiparesis. Patient was last seen normal at 6:20 the AM of 8/14 and was found by daughter a few hours later sitting in a chair, unresponsive and slanted to the left. Upon arrival the the ED the patient vomited. Her NIHSS is 22.  Found to have right MCA stroke, to IR for clot retrieval and admitted to Neuro ICU on vent.    PAST MEDICAL HISTORY :  Past Medical History  Diagnosis Date  . A-fib    No past surgical history on file. Prior to Admission medications   Medication Sig Start Date End Date Taking? Authorizing Provider  acetaminophen (TYLENOL) 500 MG tablet Take 500 mg by mouth every 6 (six) hours as needed.    Historical Provider, MD  allopurinol (ZYLOPRIM) 100 MG tablet Take 100 mg by mouth daily. 05/09/12   Historical Provider, MD  amLODipine (NORVASC) 10 MG tablet Take 5 mg by mouth daily. 05/09/12   Historical  Provider, MD  Calcium Carbonate-Vitamin D (CALCIUM + D PO) Take by mouth.    Historical Provider, MD  cholecalciferol (VITAMIN D) 1000 UNITS tablet Take 1,000 Units by mouth daily.    Historical Provider, MD  cloNIDine (CATAPRES) 0.2 MG tablet Take 0.2 mg by mouth 2 (two) times daily.    Historical Provider, MD  fenofibrate 160 MG tablet Take 160 mg by mouth daily.    Historical Provider, MD  fish oil-omega-3 fatty acids 1000 MG capsule Take 2 g by mouth daily.    Historical Provider, MD  hydrochlorothiazide (HYDRODIURIL) 25 MG tablet Take 25 mg by mouth daily.    Historical Provider, MD  losartan-hydrochlorothiazide (HYZAAR) 100-12.5 MG per tablet Take 1 tablet by mouth daily. 05/09/12   Historical Provider, MD  Melatonin 5 MG TABS Take 1 tablet by mouth daily.    Historical Provider, MD  metFORMIN (GLUCOPHAGE) 500 MG tablet Take 500 mg by mouth 2 (two) times daily.    Historical Provider, MD  Multiple Vitamin (MULTIVITAMIN) tablet Take 1 tablet by mouth daily.    Historical Provider, MD  pantoprazole (PROTONIX) 40 MG tablet Take 40 mg by mouth 2 (two) times daily.    Historical Provider, MD  simvastatin (ZOCOR) 40 MG tablet Take 40 mg by mouth daily. 05/09/12   Historical Provider, MD  sotalol (BETAPACE) 80 MG tablet Take 80 mg by  mouth 2 (two) times daily.    Historical Provider, MD  venlafaxine XR (EFFEXOR-XR) 75 MG 24 hr capsule Take 75 mg by mouth daily. 07/05/12   Historical Provider, MD  vitamin B-12 (CYANOCOBALAMIN) 1000 MCG tablet Take 1,000 mcg by mouth daily.    Historical Provider, MD   Allergies  Allergen Reactions  . Amitriptyline   . Celebrex [Celecoxib]   . Codeine Nausea Only  . Colchicine Diarrhea  . Sulfa Antibiotics Nausea Only  . Vioxx [Rofecoxib] Nausea Only    FAMILY HISTORY:  No family history on file. SOCIAL HISTORY:  reports that she has never smoked. She has never used smokeless tobacco. Her alcohol and drug histories are not on file.  REVIEW OF SYSTEMS:  Unable to obtain  VITAL SIGNS: Temp:  [97.2 F (36.2 C)] 97.2 F (36.2 C) (08/14 1515) Pulse Rate:  [51-85] 51 (08/14 1515) Resp:  [14-24] 15 (08/14 1515) BP: (125-189)/(60-109) 128/91 mmHg (08/14 1515) SpO2:  [90 %-100 %] 100 % (08/14 1445) Arterial Line BP: (141-177)/(63-78) 177/78 mmHg (08/14 1515) FiO2 (%):  [50 %] 50 % (08/14 1443) HEMODYNAMICS:   VENTILATOR SETTINGS: Vent Mode:  [-] PRVC FiO2 (%):  [50 %] 50 % Set Rate:  [14 bmp] 14 bmp Vt Set:  [400 mL] 400 mL PEEP:  [5 cmH20] 5 cmH20 Plateau Pressure:  [17 cmH20] 17 cmH20 INTAKE / OUTPUT: Intake/Output     08/13 0701 - 08/14 0700 08/14 0701 - 08/15 0700   I.V.  900   Blood  350   Total Intake   1250   Urine  450   Total Output   450   Net   +800          PHYSICAL EXAMINATION: General: elderly female, intubated  Neuro: left hemiparesis, +Gag reflex, responds to painful stimuli HEENT:  ETT in place  Cardiovascular:  RRR no MRG Lungs:  Course bilaterally Abdomen:  Obese, +BS Musculoskeletal:  No edema  Skin:  Intact   LABS:  CBC Recent Labs     17-Jul-2013  1035  07-17-2013  1046  WBC  10.4   --   HGB  15.4*  16.7*  HCT  44.2  49.0*  PLT  285   --    Coag's Recent Labs     Jul 17, 2013  1035  APTT  38*  INR  1.99*   BMET Recent Labs     2013/07/17  1035  Jul 17, 2013  1046  NA  139  141  K  3.6  3.7  CL  98  100  CO2  28   --   BUN  29*  29*  CREATININE  0.80  1.00  GLUCOSE  176*  170*   Electrolytes Recent Labs     Jul 17, 2013  1035  CALCIUM  10.0   Sepsis Markers No results found for this basename: LACTICACIDVEN, PROCALCITON, O2SATVEN,  in the last 72 hours ABG No results found for this basename: PHART, PCO2ART, PO2ART,  in the last 72 hours Liver Enzymes Recent Labs     07/17/13  1035  AST  23  ALT  21  ALKPHOS  51  BILITOT  0.2*  ALBUMIN  3.3*   Cardiac Enzymes Recent Labs     07/17/2013  1035  TROPONINI  <0.30   Glucose Recent Labs     2013/07/17  1101  GLUCAP  165*     Imaging Ct Head Wo Contrast  Jul 17, 2013   *RADIOLOGY REPORT*  Clinical Data: Code stroke.  Sudden onset left sided weakness. Decreased responsiveness.  CT HEAD WITHOUT CONTRAST  Technique:  Contiguous axial images were obtained from the base of the skull through the vertex without contrast.  Comparison: None.  Findings: There is a hyperdense right middle cerebral artery with some low attenuation in the adjacent brain parenchyma (images 13 and 14).   No evidence of acute hemorrhage, mass lesion, mass effect or hydrocephalus.  Some asymmetry is seen in the cortical and subcortical white matter of the high right frontal and parietal lobes (example images 23-25).  There may be remote lacunar infarcts in the right basal ganglia. Mild atrophy.  Mild periventricular low attenuation.  Mucosal thickening and fluid are seen in the maxillary sinuses with bilateral maxillary antrostomies.  Scattered opacification of residual ethmoid air cells and right frontal sinus as well as right sphenoid sinus.  Mastoid air cells are clear.  IMPRESSION:  1.  Acute right MCA infarct.  No corresponding acute hemorrhage. Critical Value/emergent results were called by telephone at the time of interpretation on 07-16-13 at 1051 hours to Dr. Hurman Horn, who verbally acknowledged these results. 2.  Mild atrophy and chronic microvascular white matter ischemic changes. 3.  Extensive paranasal sinus inflammatory changes.   Original Report Authenticated By: Leanna Battles, M.D.     CXR: Pending  ASSESSMENT / PLAN:  PULMONARY A: Acute Respiratory Failure in setting of AMS P:   - Maintain vent settings RR 18, PEEP 10, FiO2 60 - SPT in AM if meets criteria. No extubation until mental status supports.  - Intermittent f/u CXR  CARDIOVASCULAR A:  H/O A. fib Patient on coumadin prior to admission with INR 1.99 P:  - Cardine drip with prn labetalol for htn, goal SBP 110-120 per Deveshwar. - Hold coumadin and antiplatelets as per  protocol. - FFP given. - Echo. - Tele. - Fasting lipid profile.  RENAL A:   No active issues P:   - Trend BMET. - Replace lytes as needed.  GASTROINTESTINAL A:  NPO  P:   - NPO status, will consult nutrition for TF in AM if unable to wean. - PPI.  HEMATOLOGIC A:  Coagulopathy- on coumadin as outpatient for A. fib P:  - Recheck INR in AM.  INFECTIOUS A: No acute issues P:   - Trend WBCs and fever curve.  ENDOCRINE A:   No acute issues  P:   - HgbA1c.  NEUROLOGIC A:  R MCA distribution infarct in context of focal clot R MCA.  P:   - Neuro to follow. - S/P IR clot retrieval, not tPA candidate as patient was coagulopathic with INR of 1.99 on coumadin as outpatient for A. fib and was out of time window. - MRI/MRA brain. - Carotid dopplers. - Frequent neuro checks. - PT/OT SLP post extubation.  I have personally obtained a history, examined the patient, evaluated laboratory and imaging results, formulated the assessment and plan and placed orders.  CRITICAL CARE: The patient is critically ill with multiple organ systems failure and requires high complexity decision making for assessment and support, frequent evaluation and titration of therapies, application of advanced monitoring technologies and extensive interpretation of multiple databases. Critical Care Time devoted to patient care services described in this note is 45 minutes.   Alyson Reedy, M.D. Pulmonary and Critical Care Medicine [facility] Pager: [phone]  2013-07-16, 3:51 PM

## 2013-06-26 NOTE — ED Notes (Signed)
Per ems- Pt comes from home where she lives with her husband was LSN at 0620 was sitting in her chair found by daughter to be unresponsive with only moaning to pain. Pt does not follow commands, has whole body drift to left side. Vomiting x 2. Hx AFIB HR 70, BP 152/111, CBG 140.

## 2013-06-26 NOTE — ED Notes (Signed)
Puncture access obtained to right groin per MD at 1210

## 2013-06-26 NOTE — Consult Note (Signed)
Referring Physician: ED    Chief Complaint: CODE STROKE/ UNRESPONSIVENESS, LEFT HEMIPARESIS.  HPI:                                                                                                                                         Tara [name] is an 79 y.o. female with a past medical history significant for atrial fibrillation on coumadin, brought to [facility] ED with altered mental status and left hemiparesis. She was last seen normal at 620 am today and was found by her daughter couple of hours later sitting in a chair, unresponsive, with her whole body drifted to the left side. Upon arrival to ED she was still poorly responsive and vomited. NIHSS 22 CT brain showed a hyperdense R MCA sign and early hypodense area involving approximately less than 1/3 R MCA territory. INR 1.99   Date last known well: 06/19/2013 Time last known well: 620 am tPA Given: no, patient on coumadin INR 1.99 NIHSS: 22 MRS: 5  Past Medical History  Diagnosis Date  . A-fib     No past surgical history on file.  No family history on file. Social History:  reports that she has never smoked. She has never used smokeless tobacco. Her alcohol and drug histories are not on file.  Allergies:  Allergies  Allergen Reactions  . Amitriptyline   . Celebrex [Celecoxib]   . Codeine Nausea Only  . Colchicine Diarrhea  . Sulfa Antibiotics Nausea Only  . Vioxx [Rofecoxib] Nausea Only    Medications:                                                                                                                           I have reviewed the patient's current medications.  ROS:  Unable to obtain due to mental status  History obtained from chart review and family  Physical exam: pleasant female in no apparent distress. Blood pressure 150/90, pulse 78, resp. rate 19, SpO2 97.00%. Head:  normocephalic. Neck: supple, no bruits, no JVD. Cardiac: no murmurs. Lungs: clear. Abdomen: soft, no tender, no mass. Extremities: no edema.   Neurologic Examination:                                                                                                      Mental Status: Patient is lethargic, at times able to say a single work and follow simple commands. Cranial Nerves: II: Discs flat bilaterally; Visual fields couldn't be tested, pupils equal 2 mm, round, reactive to light III,IV, VI: ptosis not present, seem to have a right gaze preference V: can not be reliably tested VII: left face weakness. VIII: unable to test IX,X: unable to test XI: unable to test XII: midline Motor: Dense left hemiparesis Tone and bulk:normal tone throughout; no atrophy noted Sensory: reacts to pain Deep Tendon Reflexes:  1+ all over Plantars: Right: upgoing   Left: downgoing Cerebellar: Unable to test Gait: Unable to test CV: pulses palpable throughout    Results for orders placed during the hospital encounter of 07/01/2013 (from the past 48 hour(s))  PROTIME-INR     Status: Abnormal   Collection Time    07/07/2013 10:35 AM      Result Value Range   Prothrombin Time 22.0 (*) 11.6 - 15.2 seconds   INR 1.99 (*) 0.00 - 1.49  APTT     Status: Abnormal   Collection Time    07/09/2013 10:35 AM      Result Value Range   aPTT 38 (*) 24 - 37 seconds   Comment:            IF BASELINE aPTT IS ELEVATED,     SUGGEST PATIENT RISK ASSESSMENT     BE USED TO DETERMINE APPROPRIATE     ANTICOAGULANT THERAPY.  CBC     Status: Abnormal   Collection Time    06/22/2013 10:35 AM      Result Value Range   WBC 10.4  4.0 - 10.5 K/uL   RBC 4.60  3.87 - 5.11 MIL/uL   Hemoglobin 15.4 (*) 12.0 - 15.0 g/dL   HCT 84.6  96.2 - 95.2 %   MCV 96.1  78.0 - 100.0 fL   MCH 33.5  26.0 - 34.0 pg   MCHC 34.8  30.0 - 36.0 g/dL   RDW 84.1  32.4 - 40.1 %   Platelets 285  150 - 400 K/uL  DIFFERENTIAL     Status: None    Collection Time    07/10/2013 10:35 AM      Result Value Range   Neutrophils Relative % 69  43 - 77 %   Neutro Abs 7.2  1.7 - 7.7 K/uL   Lymphocytes Relative 23  12 - 46 %   Lymphs Abs 2.4  0.7 - 4.0 K/uL   Monocytes Relative 6  3 - 12 %   Monocytes Absolute 0.6  0.1 -  1.0 K/uL   Eosinophils Relative 2  0 - 5 %   Eosinophils Absolute 0.2  0.0 - 0.7 K/uL   Basophils Relative 0  0 - 1 %   Basophils Absolute 0.0  0.0 - 0.1 K/uL  POCT I-STAT TROPONIN I     Status: None   Collection Time    07/05/2013 10:44 AM      Result Value Range   Troponin i, poc 0.00  0.00 - 0.08 ng/mL   Comment 3            Comment: Due to the release kinetics of cTnI,     a negative result within the first hours     of the onset of symptoms does not rule out     myocardial infarction with certainty.     If myocardial infarction is still suspected,     repeat the test at appropriate intervals.  POCT I-STAT, CHEM 8     Status: Abnormal   Collection Time    06/23/2013 10:46 AM      Result Value Range   Sodium 141  135 - 145 mEq/L   Potassium 3.7  3.5 - 5.1 mEq/L   Chloride 100  96 - 112 mEq/L   BUN 29 (*) 6 - 23 mg/dL   Creatinine, Ser 1.47  0.50 - 1.10 mg/dL   Glucose, Bld 829 (*) 70 - 99 mg/dL   Calcium, Ion 5.62  1.30 - 1.30 mmol/L   TCO2 29  0 - 100 mmol/L   Hemoglobin 16.7 (*) 12.0 - 15.0 g/dL   HCT 86.5 (*) 78.4 - 69.6 %  GLUCOSE, CAPILLARY     Status: Abnormal   Collection Time    07/11/2013 11:01 AM      Result Value Range   Glucose-Capillary 165 (*) 70 - 99 mg/dL   Ct Head Wo Contrast  06/15/2013   *RADIOLOGY REPORT*  Clinical Data: Code stroke.  Sudden onset left sided weakness. Decreased responsiveness.  CT HEAD WITHOUT CONTRAST  Technique:  Contiguous axial images were obtained from the base of the skull through the vertex without contrast.  Comparison: None.  Findings: There is a hyperdense right middle cerebral artery with some low attenuation in the adjacent brain parenchyma (images 13 and 14).    No evidence of acute hemorrhage, mass lesion, mass effect or hydrocephalus.  Some asymmetry is seen in the cortical and subcortical white matter of the high right frontal and parietal lobes (example images 23-25).  There may be remote lacunar infarcts in the right basal ganglia. Mild atrophy.  Mild periventricular low attenuation.  Mucosal thickening and fluid are seen in the maxillary sinuses with bilateral maxillary antrostomies.  Scattered opacification of residual ethmoid air cells and right frontal sinus as well as right sphenoid sinus.  Mastoid air cells are clear.  IMPRESSION:  1.  Acute right MCA infarct.  No corresponding acute hemorrhage. Critical Value/emergent results were called by telephone at the time of interpretation on 06/16/2013 at 1051 hours to Dr. Hurman Horn, who verbally acknowledged these results. 2.  Mild atrophy and chronic microvascular white matter ischemic changes. 3.  Extensive paranasal sinus inflammatory changes.   Original Report Authenticated By: Leanna Battles, M.D.       Assessment: 79 y.o. female with acute onset left hemiparesis and altered mental status, NIHSS 22, and CT brain consistent with right MCA distribution infarct in the context of focal clot right MCA. On coumadin with INR 1.99, thus not a candidate for IV  thrombolysis. I had an ample conversation with family regarding the fact that patient neurological syndrome and CT findings are indicative of a potentially sizeable right hemisphere infarct with the possibility of developing malignant cerebral edema and hemorrhagic transformation. Other options discussed at length with family and they are in agreement of pursuing IA tpa/embolectomy even after being informed that there is not guarantee this modality of treatment will result in a favorable outcome. Patient was taken to the interventional radiology suite for further management. Will follow up closely today, and the stroke team will resume care in the morning.  Admit to NICU.     Stroke Risk Factors - age, atrial fibrillation  Plan: 1. HgbA1c, fasting lipid panel 2. MRI, MRA  of the brain without contrast 3. Echocardiogram 4. Carotid dopplers 5. Prophylactic therapy-hold coumadin and antiplatelets as per protocol 6. Risk factor modification 7. Telemetry monitoring 8. Frequent neuro checks 9. PT/OT SLP   Wyatt Portela, MD [facility] [phone]  07/08/2013, 11:22 AM

## 2013-06-26 NOTE — ED Provider Notes (Signed)
CSN: [id number]     Arrival date & time 06/25/2013  1024 History     First MD Initiated Contact with Patient 06/22/2013 1036     Chief Complaint  Patient presents with  . Code Stroke   (Consider location/radiation/quality/duration/timing/severity/associated sxs/prior Treatment) The history is provided by the EMS personnel. The history is limited by the condition of the patient and the absence of a caregiver.   79 year old female with a history of atrial fibrillation on Coumadin, hypertension presents with unresponsiveness and left-sided weakness the code stroke. Patient arrives with EMS with report that she was last seen normal at 6:20 AM. And around 9:30 AM she was found sitting in her chair unresponsive and only moving to pain.  The patient had vomited with EMS.  Past Medical History  Diagnosis Date  . A-fib    No past surgical history on file. No family history on file. History  Substance Use Topics  . Smoking status: Never Smoker   . Smokeless tobacco: Never Used  . Alcohol Use: Not on file   OB History   Grav Para Term Preterm Abortions TAB SAB Ect Mult Living                 Review of Systems  Unable to perform ROS: Mental status change    Allergies  Amitriptyline; Celebrex; Codeine; Colchicine; Sulfa antibiotics; and Vioxx  Home Medications   No current outpatient prescriptions on file. BP 110/51  Pulse 79  Temp(Src) 97.2 F (36.2 C) (Core (Comment))  Resp 18  Ht 5\' 2"  (1.575 m)  SpO2 97% Physical Exam  Nursing note and vitals reviewed. Constitutional: No distress. Nasal cannula in place.  Pt initially unresponsive, however moving right arm and with gag intact  HENT:  Head: Normocephalic and atraumatic. Head is without raccoon's eyes.  Eyes: Conjunctivae are normal.  Eyes deviated to right, right eye greater than left  Neck: Normal range of motion.  Cardiovascular: Normal rate, regular rhythm, normal heart sounds and intact distal pulses.  Exam reveals no  gallop and no friction rub.   No murmur heard. Pulmonary/Chest: Effort normal and breath sounds normal. No respiratory distress. She has no wheezes. She has no rales.  Abdominal: Soft. She exhibits no distension. There is no tenderness. There is no guarding.  Musculoskeletal: She exhibits no edema and no tenderness.  Neurological: She is unresponsive. GCS eye subscore is 1. GCS verbal subscore is 3. GCS motor subscore is 6.  Patient unable to open eyes Verbal varying from no response to incomprehensible to asking what time Motor response initially withdrawing-localizing to pain however pt following commands after CT scan.  Eyes do not cross midline looking to right, pt flaccid left upper and lower ext, left facial droop, right side strength unclear given pt lifting arm and leg up and down when asked to lift however appears 4/5.   Skin: Skin is warm and dry. No rash noted. She is not diaphoretic. No erythema.    ED Course   Procedures (including critical care time)  Labs Reviewed  PROTIME-INR - Abnormal; Notable for the following:    Prothrombin Time 22.0 (*)    INR 1.99 (*)    All other components within normal limits  APTT - Abnormal; Notable for the following:    aPTT 38 (*)    All other components within normal limits  CBC - Abnormal; Notable for the following:    Hemoglobin 15.4 (*)    All other components within normal limits  COMPREHENSIVE METABOLIC PANEL - Abnormal; Notable for the following:    Glucose, Bld 176 (*)    BUN 29 (*)    Albumin 3.3 (*)    Total Bilirubin 0.2 (*)    GFR calc non Af Amer 68 (*)    GFR calc Af Amer 79 (*)    All other components within normal limits  URINALYSIS, ROUTINE W REFLEX MICROSCOPIC - Abnormal; Notable for the following:    Hgb urine dipstick SMALL (*)    Protein, ur >300 (*)    All other components within normal limits  GLUCOSE, CAPILLARY - Abnormal; Notable for the following:    Glucose-Capillary 165 (*)    All other components  within normal limits  URINE MICROSCOPIC-ADD ON - Abnormal; Notable for the following:    Squamous Epithelial / LPF FEW (*)    Casts GRANULAR CAST (*)    All other components within normal limits  POCT I-STAT, CHEM 8 - Abnormal; Notable for the following:    BUN 29 (*)    Glucose, Bld 170 (*)    Hemoglobin 16.7 (*)    HCT 49.0 (*)    All other components within normal limits  POCT I-STAT 3, BLOOD GAS (G3+) - Abnormal; Notable for the following:    pH, Arterial 7.321 (*)    pCO2 arterial 51.2 (*)    pO2, Arterial 52.0 (*)    Bicarbonate 26.6 (*)    All other components within normal limits  MRSA PCR SCREENING  ETHANOL  DIFFERENTIAL  TROPONIN I  URINE RAPID DRUG SCREEN (HOSP PERFORMED)  LIPID PANEL  BLOOD GAS, ARTERIAL  HEMOGLOBIN A1C  LIPID PANEL  CBC WITH DIFFERENTIAL  BASIC METABOLIC PANEL  CBC  MAGNESIUM  PHOSPHORUS  BLOOD GAS, ARTERIAL  POCT I-STAT TROPONIN I  POCT ACTIVATED CLOTTING TIME  POCT ACTIVATED CLOTTING TIME  PREPARE FRESH FROZEN PLASMA  ABO/RH   Ct Head Wo Contrast  07/11/2013   *RADIOLOGY REPORT*  Clinical Data: Code stroke.  Sudden onset left sided weakness. Decreased responsiveness.  CT HEAD WITHOUT CONTRAST  Technique:  Contiguous axial images were obtained from the base of the skull through the vertex without contrast.  Comparison: None.  Findings: There is a hyperdense right middle cerebral artery with some low attenuation in the adjacent brain parenchyma (images 13 and 14).   No evidence of acute hemorrhage, mass lesion, mass effect or hydrocephalus.  Some asymmetry is seen in the cortical and subcortical white matter of the high right frontal and parietal lobes (example images 23-25).  There may be remote lacunar infarcts in the right basal ganglia. Mild atrophy.  Mild periventricular low attenuation.  Mucosal thickening and fluid are seen in the maxillary sinuses with bilateral maxillary antrostomies.  Scattered opacification of residual ethmoid air cells  and right frontal sinus as well as right sphenoid sinus.  Mastoid air cells are clear.  IMPRESSION:  1.  Acute right MCA infarct.  No corresponding acute hemorrhage. Critical Value/emergent results were called by telephone at the time of interpretation on 07/13/2013 at 1051 hours to Dr. Hurman Horn, who verbally acknowledged these results. 2.  Mild atrophy and chronic microvascular white matter ischemic changes. 3.  Extensive paranasal sinus inflammatory changes.   Original Report Authenticated By: Leanna Battles, M.D.   Date: 06/24/2013  Rate: 87  Rhythm: atrial fibrillation  QRS Axis: left  Intervals: QTc prolonged borderline  ST/T Wave abnormalities: normal  Conduction Disutrbances: incomplete rbb  Narrative Interpretation:   Old EKG Reviewed: none available  1. Acute respiratory failure   2. Altered mental status   3. HTN (hypertension)   4. Acute right MCA infarct    MDM  79 year old female with a history of atrial fibrillation on Coumadin, hypertension presents with unresponsiveness and left-sided weakness as a code stroke with last normal 4h prior to arrival.  Patient protecting her airway and brought emergently to CT scanner with neurology at bedside.  CT significant for acute right MCA infarct. Patient following commands and moving the right side, however with likely lid apraxia,  eyes deviated towards the right, and left upper and lower extremity paralysis.  Patient was brought urgently to IR for intervention for acute stroke.      Rhae Lerner, MD 07/03/13 1723  Rhae Lerner, MD 2013-07-03 [phone]

## 2013-06-27 ENCOUNTER — Encounter (HOSPITAL_COMMUNITY): Payer: Self-pay | Admitting: Interventional Radiology

## 2013-06-27 DIAGNOSIS — I63511 Cerebral infarction due to unspecified occlusion or stenosis of right middle cerebral artery: Secondary | ICD-10-CM | POA: Diagnosis present

## 2013-06-27 DIAGNOSIS — G935 Compression of brain: Secondary | ICD-10-CM | POA: Diagnosis not present

## 2013-06-27 DIAGNOSIS — G936 Cerebral edema: Secondary | ICD-10-CM | POA: Diagnosis not present

## 2013-06-27 LAB — BASIC METABOLIC PANEL
BUN: 26 mg/dL — ABNORMAL HIGH (ref 6–23)
GFR calc Af Amer: 78 mL/min — ABNORMAL LOW (ref >90)
GFR calc non Af Amer: 67 mL/min — ABNORMAL LOW (ref >90)
Potassium: 3 mEq/L — ABNORMAL LOW (ref 3.5–5.1)
Sodium: 143 mEq/L (ref 135–145)

## 2013-06-27 LAB — POCT I-STAT 3, ART BLOOD GAS (G3+)
Bicarbonate: 25.1 mEq/L — ABNORMAL HIGH (ref 20.0–24.0)
O2 Saturation: 94 %
Patient temperature: 36.5
TCO2: 26 mmol/L (ref 0–100)
pCO2 arterial: 41.1 mmHg (ref 35.0–45.0)
pH, Arterial: 7.393 (ref 7.350–7.450)
pO2, Arterial: 70 mmHg — ABNORMAL LOW (ref 80.0–100.0)

## 2013-06-27 LAB — BLOOD GAS, ARTERIAL
Acid-base deficit: 2.8 mmol/L — ABNORMAL HIGH (ref 0.0–2.0)
Drawn by: 24513
FIO2: 60 %
RATE: 18 resp/min
pCO2 arterial: 36.6 mmHg (ref 35.0–45.0)
pO2, Arterial: 86.2 mmHg (ref 80.0–100.0)

## 2013-06-27 LAB — HEMOGLOBIN A1C: Hgb A1c MFr Bld: 6.1 % — ABNORMAL HIGH (ref <5.7)

## 2013-06-27 LAB — CBC WITH DIFFERENTIAL/PLATELET
Basophils Relative: 0 % (ref 0–1)
Eosinophils Absolute: 0 10*3/uL (ref 0.0–0.7)
MCH: 31.8 pg (ref 26.0–34.0)
MCHC: 33.2 g/dL (ref 30.0–36.0)
Monocytes Relative: 4 % (ref 3–12)
Neutrophils Relative %: 88 % — ABNORMAL HIGH (ref 43–77)
Platelets: 258 10*3/uL (ref 150–400)
RDW: 13.3 % (ref 11.5–15.5)

## 2013-06-27 LAB — PREPARE FRESH FROZEN PLASMA: Unit division: 0

## 2013-06-27 LAB — SODIUM: Sodium: 148 mEq/L — ABNORMAL HIGH (ref 135–145)

## 2013-06-27 LAB — PROTIME-INR
INR: 2.42 — ABNORMAL HIGH (ref 0.00–1.49)
Prothrombin Time: 25.5 seconds — ABNORMAL HIGH (ref 11.6–15.2)

## 2013-06-27 LAB — LIPID PANEL
Cholesterol: 134 mg/dL (ref 0–200)
Total CHOL/HDL Ratio: 3.3 RATIO

## 2013-06-27 MED ORDER — LORAZEPAM 2 MG/ML IJ SOLN
1.0000 mg | INTRAMUSCULAR | Status: DC | PRN
  Administered 2013-06-28: 1 mg via INTRAVENOUS
  Filled 2013-06-27 (×2): qty 1

## 2013-06-27 MED ORDER — MORPHINE BOLUS VIA INFUSION
5.0000 mg | INTRAVENOUS | Status: DC | PRN
  Filled 2013-06-27: qty 20

## 2013-06-27 MED ORDER — NICARDIPINE HCL IN NACL 40-0.83 MG/200ML-% IV SOLN
5.0000 mg/h | INTRAVENOUS | Status: DC
  Filled 2013-06-27: qty 200

## 2013-06-27 MED ORDER — MORPHINE SULFATE 2 MG/ML IJ SOLN
1.0000 mg | INTRAMUSCULAR | Status: DC | PRN
  Administered 2013-06-27: 3 mg via INTRAVENOUS
  Administered 2013-06-27: 1 mg via INTRAVENOUS
  Administered 2013-06-28 (×2): 2 mg via INTRAVENOUS
  Filled 2013-06-27: qty 1
  Filled 2013-06-27: qty 2
  Filled 2013-06-27 (×2): qty 1

## 2013-06-27 MED ORDER — LORAZEPAM BOLUS VIA INFUSION
2.0000 mg | INTRAVENOUS | Status: DC | PRN
  Filled 2013-06-27: qty 5

## 2013-06-27 MED FILL — Ondansetron HCl Inj 4 MG/2ML (2 MG/ML): INTRAMUSCULAR | Qty: 2 | Status: AC

## 2013-06-27 NOTE — Anesthesia Postprocedure Evaluation (Signed)
  Anesthesia Post-op Note  Patient: Tara [name]  Procedure(s) Performed: Procedure(s): RADIOLOGY WITH ANESTHESIA (N/A)  Patient Location: NICU  Anesthesia Type:General  Level of Consciousness: unresponsive and comatose  Airway and Oxygen Therapy: Patient remains intubated per anesthesia plan and Patient placed on Ventilator (see vital sign flow sheet for setting)  Post-op Pain: none  Post-op Assessment: Post-op Vital signs reviewed and Patient's Cardiovascular Status Stable  Post-op Vital Signs: stable  Complications: No apparent anesthesia complications

## 2013-06-27 NOTE — Progress Notes (Signed)
Elink MD aware of low urine output throughout the night. Will address this morning.

## 2013-06-27 NOTE — Procedures (Signed)
Extubation Procedure Note  Patient Details:   Name: Tara [name] DOB: Apr 06, 1932 MRN: [id number]   Airway Documentation:  Airway 7 mm (Active)  Secured at (cm) 24 cm 06/27/2013  7:32 AM  Measured From Lips 06/27/2013  7:32 AM  Secured Location Center 06/27/2013  7:32 AM  Secured By [employer] 06/27/2013  7:32 AM  Tube Holder Repositioned Yes 06/27/2013  7:32 AM  Cuff Pressure (cm H2O) 22 cm H2O 06/27/2013  7:32 AM  Site Condition Dry 06/27/2013  7:32 AM    Evaluation  O2 sats: stable throughout Complications: No apparent complications Patient did tolerate procedure well. Bilateral Breath Sounds: Clear;Diminished Suctioning: Airway No Terminal wean per order [name] 06/27/2013, 12:40 PM

## 2013-06-27 NOTE — Progress Notes (Addendum)
Stroke Team Progress Note  HISTORY Tara [name] is an 79 y.o. female with a past medical history significant for atrial fibrillation on coumadin, brought to [facility] ED Jul 23, 2013 at 1024 with altered mental status and left hemiparesis. She was last seen normal at 620 am 2013-07-23 and was found by her daughter couple of hours later sitting in a chair, unresponsive, with her whole body drifted to the left side.  Upon arrival to ED she was still poorly responsive and vomited.  NIHSS 22. CT brain showed a hyperdense R MCA sign and early hypodense area involving approximately less than 1/3 R MCA territory.  INR 1.99   [name] was not a TPA candidate secondary to high INR 1.99. CT brain showed a hyperdense R MCA sign and early hypodense area involving approximately less than 1/3 R MCA territory. [name] was taken to the interventional radiology suite where she received complete angiograhic recanalization of occluded RT ICA,RT MCA and RT ACA using 2 passes with the trevoprovue device.  She was admitted to the neuro ICU for further evaluation and treatment.  SUBJECTIVE Her family - husband, daughters, are at the bedside.  Overall they feel her condition is stable.   OBJECTIVE Most recent Vital Signs: Filed Vitals:   06/27/13 0700 06/27/13 0732 06/27/13 0800 06/27/13 0900  BP: 134/50  125/42 122/50  Pulse: 96  90 102  Temp:  97.7 F (36.5 C)    TempSrc:  Axillary    Resp: 20  21 23   Height:      Weight:      SpO2: 100%  99% 99%   CBG (last 3)   Recent Labs  07-23-13 1101  GLUCAP 165*    IV Fluid Intake:   . sodium chloride Stopped (July 23, 2013 2339)  . niCARDipine    . niCARDipine 15 mg/hr (06/27/13 0904)  . propofol Stopped (06/27/13 0845)  . sodium chloride (hypertonic) 75 mL/hr at 06/27/13 0800    MEDICATIONS  . antiseptic oral rinse  15 mL Mouth Rinse QID  . chlorhexidine  15 mL Mouth Rinse BID   PRN:  acetaminophen, acetaminophen, fentaNYL, labetalol, ondansetron (ZOFRAN) IV,  senna-docusate  Diet:  NPO  Activity:  Bedrest DVT Prophylaxis:  SCDs   CLINICALLY SIGNIFICANT STUDIES Basic Metabolic Panel:   Recent Labs Lab 2013/07/23 1035 07-23-13 1046 July 23, 2013 2059 06/27/13 0215  NA 139 141 140 143  K 3.6 3.7  --  3.0*  CL 98 100  --  107  CO2 28  --   --  23  GLUCOSE 176* 170*  --  223*  BUN 29* 29*  --  26*  CREATININE 0.80 1.00  --  0.81  CALCIUM 10.0  --   --  8.1*  MG  --   --   --  1.5  PHOS  --   --   --  3.5   Liver Function Tests:   Recent Labs Lab Jul 23, 2013 1035  AST 23  ALT 21  ALKPHOS 51  BILITOT 0.2*  PROT 7.2  ALBUMIN 3.3*   CBC:   Recent Labs Lab 2013-07-23 1035 2013/07/23 1046 06/27/13 0215  WBC 10.4  --  19.1*  NEUTROABS 7.2  --  16.9*  HGB 15.4* 16.7* 13.0  HCT 44.2 49.0* 39.2  MCV 96.1  --  95.8  PLT 285  --  258   Coagulation:   Recent Labs Lab 2013/07/23 1035  LABPROT 22.0*  INR 1.99*   Cardiac Enzymes:   Recent Labs Lab 07/23/2013 1035  TROPONINI <0.30   Urinalysis:   Recent Labs Lab 07/11/2013 1130  COLORURINE YELLOW  LABSPEC 1.018  PHURINE 7.0  GLUCOSEU NEGATIVE  HGBUR SMALL*  BILIRUBINUR NEGATIVE  KETONESUR NEGATIVE  PROTEINUR >300*  UROBILINOGEN 0.2  NITRITE NEGATIVE  LEUKOCYTESUR NEGATIVE   Lipid Panel    Component Value Date/Time   CHOL 134 06/27/2013 0215   TRIG 204* 06/27/2013 0215   HDL 41 06/27/2013 0215   CHOLHDL 3.3 06/27/2013 0215   VLDL 41* 06/27/2013 0215   LDLCALC 52 06/27/2013 0215   HgbA1C  Lab Results  Component Value Date   HGBA1C 6.1* 06/27/2013    Urine Drug Screen:     Component Value Date/Time   LABOPIA NONE DETECTED 07/04/2013 1130   COCAINSCRNUR NONE DETECTED 07/05/2013 1130   LABBENZ NONE DETECTED 06/27/2013 1130   AMPHETMU NONE DETECTED 06/27/2013 1130   THCU NONE DETECTED 07/09/2013 1130   LABBARB NONE DETECTED 06/27/2013 1130    Alcohol Level:   Recent Labs Lab 07/06/2013 1035  ETH <11    CT of the brain   06/15/2013  Significant changes in the right  cerebral hemisphere consistent with progressive ischemia.  There is now significant mass effect or midline shift from right to left of approximately 11 mm.  There also changes in the left frontal lobe consistent with acute ischemia Tara is likely related to compression from the midline shift.  Diffuse increased density within the parenchyma of the right cerebral hemisphere vertically in the region of the  basal ganglia likely related to staining from recent arteriogram.  There is one that focus which may represent some localized hemorrhage although it is difficult to assess on the basis of Tara exam.  Continued follow-up is recommended.   07/05/2013    1.  Acute right MCA infarct.  No corresponding acute hemorrhage.   Cerebral angio S/P RT common carotid arteriogram followed by by complete angiograhic recanalization of occluded RT ICA,RT MCA and RT ACA using 2 passes with the trevoprovue device..  Isolated rt cerebral cerebral hemisphere with no collaterals.  MRI of the brain    MRA of the brain    2D Echocardiogram    Carotid Doppler    CXR  06/27/2013  1.  Support apparatus, as above.  Endotracheal tube position is slightly low, and could be withdrawn approximate 2 cm for more optimal placement. 2.  There is likely a background of mild interstitial pulmonary edema. 3.  In addition, there appears to be some multifocal air space disease.  Typically, Tara is related to infection, however, there is clinical concern for pulmonary embolism, the possibility of multifocal pulmonary infarctions is not excluded.  Tara could be better evaluated with PE protocol CT scan if clinically indicated. 4.  Mild cardiomegaly. 5.  Atherosclerosis.  EKG  atrial fibrillation, rate 139  Therapy Recommendations   Physical Exam   Elderly Caucasian lady who is intubated. . Afebrile. Head is nontraumatic. Neck is supple without bruit.  . Cardiac exam no murmur or gallop. Lungs are clear to auscultation. Distal pulses are  well felt.bilateral groin sheaths. Neurological Exam ; comatose and unresponsive. Eyes are closed. Right pupil is 5 mm irregular non-reactive. Left pupil is 4 mm irregular and slightly reactive. Corneal reflexes are present. Eyes are primary position and also movements are sluggish. Her face is symmetric. Tongue is midline. Response to sternal rub by mild posturing of the left approximately a minimum withdrawal of the right upper extremity and mild withdrawal of the lower extremities.  Tone is normal. Deep tendon reflexes are diminished both plantars are upgoing. ASSESSMENT Tara [name] is a 79 y.o. female presenting with altered mental status, left hemiparesis, lethargic and vomiting. Angio revealed an occluded right carotid. Status post recanalization of occluded RT ICA,RT MCA and RT ACA using mechanical thrombectomy (2 passes trevo 01-Jul-2013 at 1255. Imaging confirms a right MCA  Infarct with cytotoxic cerebral edema, transfalcine herniation with 11 mm shift and hemorrhage (symptomatic) into the stroke. Started on 3% saline protocol in order to decrease cerebral edema. Infarct felt to be  embolic secondary to known atrial fibrillation.  On warfarin prior to admission; INR on arrival 1.9. Now on no antithrombotics for secondary stroke prevention. [name] with resultant VDRF, comatose state, dysphagia, left hemiparesis. Work up underway.  Goal is induced hypernatremia with 3% saline protocol in order to decrease cerebral edema. CL placed. Na 143 atrial fibrillation, on coumadin PTA Hypertension  Hyperlipidemia, LDL 52, on zocor 40 PTA, now on no statin (NPO), goal LDL < 100 (< 70 for diabetics) Decreased urine OP  Hypokalemia, 3.0  Leukocytosis, 19.1  HgbA1c 6.1  Dr. Pearlean Brownie discussed diagnosis, prognosis,  treatment options and plan of care with family, nurse.  Neuro prognosis for recovery is poor. Family is not sure she would want to live that way.  Hospital day #  1  TREATMENT/PLAN  DNR.  Family to talk amongst themselves in consideration of comfort care. They will let us know.  Continue current treatments. Will not escalate care.  Annie Main, MSN, RN, ANVP-BC, ANP-BC, GNP-BC [facility] Pager: [phone] 06/27/2013 9:15 AM Tara [name] is critically ill and at significant risk of neurological worsening, death and care requires constant monitoring of vital signs, hemodynamics,respiratory and cardiac monitoring,review of multiple databases, neurological assessment, discussion with family, other specialists and medical decision making of high complexity. I spent 40 minutes of neurocritical care time  in the care of  Tara [name]. I have personally obtained a history, examined the [name], evaluated imaging results, and formulated the assessment and plan of care. I agree with the above. Delia Heady, MD

## 2013-06-27 NOTE — Progress Notes (Signed)
Chaplain Note:  Chaplain visited with pt and pt's family.  Pt was in bed, asleep, receiving comfort care, actively dying.  Pt's family was at bedside in support of pt.  Chaplain provided spiritual comfort, support, and prayer for pt and pt's family.  Pt's family expressed appreciation for chaplain support.  Chaplain will follow up as needed.  06/27/13 1400  Clinical Encounter Type  Visited With Patient and family together  Visit Type Spiritual support;Patient actively dying  Referral From Nurse  Spiritual Encounters  Spiritual Needs Prayer;Emotional;Grief support  Stress Factors  Family Stress Factors Exhausted;Loss;Major life changes  Tara [name], Chaplain [phone]

## 2013-06-27 NOTE — Progress Notes (Signed)
Progress Note: Contacted by nursing that patient was less responsive and had unequal pupils.  STAT head CT ordered and reviewed.  CT of the head shows progressive ischemia with associated significant mass effect and right to left midline shift of 11mm.   With change in neurologic examination hypertonic saline was started in an attempt to address increased intracranial pressure.  On examination left pupil 3mm and reactive.  Right pupil 6mm and unreactive. Case discussed with critical care and central line placed.    Thana Farr, MD [facility] [phone] 06/27/2013,  11:58PM

## 2013-06-27 NOTE — Progress Notes (Signed)
Vital signs remain strong post extubation. Sheath remains in place. RN will call radiology to get sheath discontinued. Once stable after removal, will transfer to the floor for comfort care. Will write orders and RN will institute once stable.  Annie Main, MSN, RN, ANVP-BC, ANP-BC, GNP-BC [facility] Pager: [phone] 06/27/2013 4:26 PM  I have personally   formulated the assessment and plan of care. I agree with the above. Delia Heady, MD

## 2013-06-27 NOTE — Progress Notes (Signed)
UR completed 

## 2013-06-27 NOTE — Progress Notes (Signed)
PULMONARY  / CRITICAL CARE MEDICINE  Name: Chantay Whitelock MRN: [id number] DOB: Jul 24, 1932    ADMISSION DATE:  2013-07-26 CONSULTATION DATE: July 26, 2013  REFERRING MD :   PRIMARY SERVICE:   CHIEF COMPLAINT:  Code stroke / unresponsiveness, left hemiparesis   BRIEF PATIENT DESCRIPTION: Jadis Pitter is a 21 yof with pmh significant for a fib on coumadin, brought to [facility] ED on 07-26-13  with AMS and left hemiparesis. Found to have right MCA stroke, to IR for clot retrieval and admitted to Neuro ICU on vent.   SIGNIFICANT EVENTS / STUDIES:  8/14 : Normal mentation at 6:20 AM 8/14, daughter found pt a few hours later unresponsive with left hemiparesis  8/14: CT brain showed hyperdense R MCA sign and early hypodense area involving approx less than 1/3 R MCA territory.   LINES / TUBES: 8/14: L Radial A line >>> 8/14: R Femoral A line >>> 8/14: NG/OG Tube >>>  CULTURES: 8/14 MRSA >>>   ANTIBIOTICS: 8/14 Ancef Periop  HISTORY OF PRESENT ILLNESS:  Debborah Alonge is a 44 yof with pmh significant for a fib on coumadin, brought to [facility] ED on 2013/07/26  with AMS and left hemiparesis. Patient was last seen normal at 6:20 the AM of 8/14 and was found by daughter a few hours later sitting in a chair, unresponsive and slanted to the left. Upon arrival the the ED the patient vomited. Her NIHSS is 22.  Found to have right MCA stroke, to IR for clot retrieval and admitted to Neuro ICU on vent.    PAST MEDICAL HISTORY :  Past Medical History  Diagnosis Date  . A-fib    No past surgical history on file. Prior to Admission medications   Medication Sig Start Date End Date Taking? Authorizing Provider  acetaminophen (TYLENOL) 500 MG tablet Take 500 mg by mouth every 6 (six) hours as needed.    Historical Provider, MD  allopurinol (ZYLOPRIM) 100 MG tablet Take 100 mg by mouth daily. 05/09/12   Historical Provider, MD  amLODipine (NORVASC) 10 MG tablet Take 5 mg by mouth daily. 05/09/12   Historical  Provider, MD  Calcium Carbonate-Vitamin D (CALCIUM + D PO) Take by mouth.    Historical Provider, MD  cholecalciferol (VITAMIN D) 1000 UNITS tablet Take 1,000 Units by mouth daily.    Historical Provider, MD  cloNIDine (CATAPRES) 0.2 MG tablet Take 0.2 mg by mouth 2 (two) times daily.    Historical Provider, MD  fenofibrate 160 MG tablet Take 160 mg by mouth daily.    Historical Provider, MD  fish oil-omega-3 fatty acids 1000 MG capsule Take 2 g by mouth daily.    Historical Provider, MD  hydrochlorothiazide (HYDRODIURIL) 25 MG tablet Take 25 mg by mouth daily.    Historical Provider, MD  losartan-hydrochlorothiazide (HYZAAR) 100-12.5 MG per tablet Take 1 tablet by mouth daily. 05/09/12   Historical Provider, MD  Melatonin 5 MG TABS Take 1 tablet by mouth daily.    Historical Provider, MD  metFORMIN (GLUCOPHAGE) 500 MG tablet Take 500 mg by mouth 2 (two) times daily.    Historical Provider, MD  Multiple Vitamin (MULTIVITAMIN) tablet Take 1 tablet by mouth daily.    Historical Provider, MD  pantoprazole (PROTONIX) 40 MG tablet Take 40 mg by mouth 2 (two) times daily.    Historical Provider, MD  simvastatin (ZOCOR) 40 MG tablet Take 40 mg by mouth daily. 05/09/12   Historical Provider, MD  sotalol (BETAPACE) 80 MG tablet Take 80 mg by  mouth 2 (two) times daily.    Historical Provider, MD  venlafaxine XR (EFFEXOR-XR) 75 MG 24 hr capsule Take 75 mg by mouth daily. 07/05/12   Historical Provider, MD  vitamin B-12 (CYANOCOBALAMIN) 1000 MCG tablet Take 1,000 mcg by mouth daily.    Historical Provider, MD   Allergies  Allergen Reactions  . Amitriptyline   . Celebrex [Celecoxib]   . Codeine Nausea Only  . Colchicine Diarrhea  . Sulfa Antibiotics Nausea Only  . Vioxx [Rofecoxib] Nausea Only    FAMILY HISTORY:  No family history on file. SOCIAL HISTORY:  reports that she has never smoked. She has never used smokeless tobacco. Her alcohol and drug histories are not on file.  REVIEW OF SYSTEMS:  Unable to obtain  VITAL SIGNS: Temp:  [97.2 F (36.2 C)-97.9 F (36.6 C)] 97.7 F (36.5 C) (08/15 0732) Pulse Rate:  [40-111] 95 (08/15 1000) Resp:  [14-24] 22 (08/15 1000) BP: (102-153)/(42-107) 135/54 mmHg (08/15 1000) SpO2:  [73 %-100 %] 73 % (08/15 1241) Arterial Line BP: (112-177)/(48-102) 143/73 mmHg (08/15 1000) FiO2 (%):  [50 %-60 %] 50 % (08/15 0732) Weight:  [101.2 kg (223 lb 1.7 oz)-102.1 kg (225 lb 1.4 oz)] 101.2 kg (223 lb 1.7 oz) (08/14 2000) HEMODYNAMICS:   VENTILATOR SETTINGS: Vent Mode:  [-] PRVC FiO2 (%):  [50 %-60 %] 50 % Set Rate:  [14 bmp-18 bmp] 18 bmp Vt Set:  [400 mL] 400 mL PEEP:  [5 cmH20-10 cmH20] 10 cmH20 Plateau Pressure:  [17 cmH20-21 cmH20] 20 cmH20 INTAKE / OUTPUT: Intake/Output     08/14 0701 - 08/15 0700 08/15 0701 - 08/16 0700   I.V. (mL/kg) 1311.7 (13) 150 (1.5)   Blood 700    NG/GT  30   Total Intake(mL/kg) 2011.7 (19.9) 180 (1.8)   Urine (mL/kg/hr) 965 105 (0.2)   Emesis/NG output 250    Total Output 1215 105   Net +796.7 +75        Emesis Occurrence 2 x      PHYSICAL EXAMINATION: General: elderly female, intubated  Neuro: left hemiparesis, +Gag reflex, responds to painful stimuli HEENT:  ETT in place  Cardiovascular:  RRR no MRG Lungs:  Course bilaterally Abdomen:  Obese, +BS Musculoskeletal:  No edema  Skin:  Intact   LABS:  CBC Recent Labs     07-01-13  1035  07/01/2013  1046  06/27/13  0215  WBC  10.4   --   19.1*  HGB  15.4*  16.7*  13.0  HCT  44.2  49.0*  39.2  PLT  285   --   258   Coag's Recent Labs     July 01, 2013  1035  APTT  38*  INR  1.99*   BMET Recent Labs     07/01/2013  1035  07-01-2013  1046  07-01-13  2059  06/27/13  0215  06/27/13  0859  NA  139  141  140  143  148*  K  3.6  3.7   --   3.0*   --   CL  98  100   --   107   --   CO2  28   --    --   23   --   BUN  29*  29*   --   26*   --   CREATININE  0.80  1.00   --   0.81   --   GLUCOSE  176*  170*   --  223*   --     Electrolytes Recent Labs     2013/07/22  1035  06/27/13  0215  CALCIUM  10.0  8.1*  MG   --   1.5  PHOS   --   3.5   Sepsis Markers No results found for this basename: LACTICACIDVEN, PROCALCITON, O2SATVEN,  in the last 72 hours ABG Recent Labs     July 22, 2013  1554  Jul 22, 2013  1643  06/27/13  0355  PHART  7.321*  7.393  7.383  PCO2ART  51.2*  41.1  36.6  PO2ART  52.0*  70.0*  86.2   Liver Enzymes Recent Labs     22-Jul-2013  1035  AST  23  ALT  21  ALKPHOS  51  BILITOT  0.2*  ALBUMIN  3.3*   Cardiac Enzymes Recent Labs     2013-07-22  1035  TROPONINI  <0.30   Glucose Recent Labs     07-22-13  1101  GLUCAP  165*    Imaging Ct Head Wo Contrast  22-Jul-2013   *RADIOLOGY REPORT*  Clinical Data: Neurological changes following interventional procedure  CT HEAD WITHOUT CONTRAST  Technique:  Contiguous axial images were obtained from the base of the skull through the vertex without contrast.  Comparison: Jul 22, 2013 1030 hours  Findings: The bony calvarium is intact.  No gross soft tissue abnormality is noted.  When compared with the prior exam, there has been significant decrease in attenuation throughout the right cerebral hemisphere consistent with diffuse ischemia.  There are patchy areas of increased attenuation identified likely related to some degree of contrast staining from the recent arteriogram. There is a focus of increased attenuation identified adjacent to this staining.  It would be difficult to exclude a focus of small hemorrhage.  This lies in the expected region of the right basal ganglia.  There is now midline shift from right to left of approximately 11 mm.  Considerable decreased attenuation and sulcal blunting is noted in the left frontal lobe.  This may represent diffuse ischemia related to the midline shift.  IMPRESSION: Significant changes in the right cerebral hemisphere consistent with progressive ischemia.  There is now significant mass effect or midline shift  from right to left of approximately 11 mm.  There also changes in the left frontal lobe consistent with acute ischemia this is likely related to compression from the midline shift.  Diffuse increased density within the parenchyma of the right cerebral hemisphere vertically in the region of the  basal ganglia likely related to staining from recent arteriogram.  There is one that focus which may represent some localized hemorrhage although it is difficult to assess on the basis of this exam.  Continued follow-up is recommended.  These results were called by telephone on July 22, 2013 at 2049 hours to [facility] the patient's nurse, who verbally acknowledged these results.   Original Report Authenticated By: Alcide Clever, M.D.   Ct Head Wo Contrast  07-22-13   *RADIOLOGY REPORT*  Clinical Data: Code stroke.  Sudden onset left sided weakness. Decreased responsiveness.  CT HEAD WITHOUT CONTRAST  Technique:  Contiguous axial images were obtained from the base of the skull through the vertex without contrast.  Comparison: None.  Findings: There is a hyperdense right middle cerebral artery with some low attenuation in the adjacent brain parenchyma (images 13 and 14).   No evidence of acute hemorrhage, mass lesion, mass effect or hydrocephalus.  Some asymmetry is seen in the cortical and subcortical white matter of  the high right frontal and parietal lobes (example images 23-25).  There may be remote lacunar infarcts in the right basal ganglia. Mild atrophy.  Mild periventricular low attenuation.  Mucosal thickening and fluid are seen in the maxillary sinuses with bilateral maxillary antrostomies.  Scattered opacification of residual ethmoid air cells and right frontal sinus as well as right sphenoid sinus.  Mastoid air cells are clear.  IMPRESSION:  1.  Acute right MCA infarct.  No corresponding acute hemorrhage. Critical Value/emergent results were called by telephone at the time of interpretation on 07/03/2013 at 1051 hours  to Dr. Hurman Horn, who verbally acknowledged these results. 2.  Mild atrophy and chronic microvascular white matter ischemic changes. 3.  Extensive paranasal sinus inflammatory changes.   Original Report Authenticated By: Leanna Battles, M.D.   Dg Chest Portable 1 View  06/27/2013   *RADIOLOGY REPORT*  Clinical Data: Evaluate for pulmonary embolism.  PORTABLE CHEST - 1 VIEW  Comparison: No priors.  Findings: An endotracheal tube is in place with tip 1.4 cm above the carina. A nasogastric tube is seen extending into the stomach, however, the tip of the nasogastric tube extends below the lower margin of the image. There is a right-sided internal jugular central venous catheter with tip terminating in the distal superior vena cava. Lung volumes are slightly low.  Bibasilar opacities (left greater than right) which may reflect areas of atelectasis and/or consolidation.  Right suprahilar airspace disease concerning for consolidation.  Mild diffuse interstitial prominence.  Mild cephalization of the pulmonary vasculature.  Mild cardiomegaly. The patient is rotated to the left on today's exam, resulting in distortion of the mediastinal contours and reduced diagnostic sensitivity and specificity for mediastinal pathology. Atherosclerosis in the thoracic aorta.  IMPRESSION: 1.  Support apparatus, as above.  Endotracheal tube position is slightly low, and could be withdrawn approximate 2 cm for more optimal placement. 2.  There is likely a background of mild interstitial pulmonary edema. 3.  In addition, there appears to be some multifocal air space disease.  Typically, this is related to infection, however, there is clinical concern for pulmonary embolism, the possibility of multifocal pulmonary infarctions is not excluded.  This could be better evaluated with PE protocol CT scan if clinically indicated. 4.  Mild cardiomegaly. 5.  Atherosclerosis.   Original Report Authenticated By: Trudie Reed, M.D.     CXR:  Pending  ASSESSMENT / PLAN:  PULMONARY A: Acute Respiratory Failure in setting of AMS P:   - Terminal extubation today. - Comfort measure.   CARDIOVASCULAR A:  H/O A. fib Patient on coumadin prior to admission with INR 1.99 P:  - D/C additional medications, full comfort care.  RENAL A:   No active issues P:   - D/C further blood work.  GASTROINTESTINAL A:  NPO  P:   - NPO and d/c PPI.  HEMATOLOGIC A:  Coagulopathy- on coumadin as outpatient for A. fib P:  - D/C further blood work.  INFECTIOUS A: No acute issues P:   - No further interventions.  ENDOCRINE A:   No acute issues  P:   - D/C CBGs.  NEUROLOGIC A:  R MCA distribution infarct in context of focal clot R MCA.  P:   - Poor prognosis per neuro, terminally extubate and maintain comfortable.  PCCM will sign off, please call back if needed.  I have personally obtained a history, examined the patient, evaluated laboratory and imaging results, formulated the assessment and plan and placed orders.  CRITICAL CARE:  The patient is critically ill with multiple organ systems failure and requires high complexity decision making for assessment and support, frequent evaluation and titration of therapies, application of advanced monitoring technologies and extensive interpretation of multiple databases. Critical Care Time devoted to patient care services described in this note is 35 minutes.   Alyson Reedy, M.D. Pulmonary and Critical Care Medicine [facility] Pager: [phone]  06/27/2013, 1:15 PM

## 2013-06-27 NOTE — Progress Notes (Addendum)
Patient's daughter reports that family wishes to withdraw life support. Stroke MD made aware. New orders placed. Will continue to monitor.

## 2013-06-27 NOTE — Progress Notes (Signed)
Family ready to withdrawal care. Orders written. Do not expect she will survive extubation. If she does, will transfer to floor for comfort care, consider GIP.  Annie Main, MSN, RN, ANVP-BC, ANP-BC, [facility] Pager: [phone] 06/27/2013 10:08 AM  I have personally  formulated the assessment and plan of care. I agree with the above. Delia Heady, MD

## 2013-06-27 NOTE — Progress Notes (Signed)
Nutrition Brief Note  Chart reviewed. Pt now transitioning to comfort care.  No nutrition interventions warranted at this time.  Please consult as needed.   Zaelyn Noack, RD, LDN, CNSC Pager [phone] After Hours Pager [phone]    

## 2013-06-27 NOTE — Progress Notes (Signed)
PT Cancellation Note  Patient Details Name: Tara [name] MRN: [id number] DOB: 03/24/1932   Cancelled Treatment:    Reason Eval/Treat Not Completed: Patient not medically ready   Haroun Cotham 06/27/2013, 9:45 AM

## 2013-06-30 NOTE — ED Provider Notes (Signed)
I saw and evaluated the patient, reviewed the resident's note and I agree with the findings and plan and agree with their ECG interpretation.. patient with acute stroke. Last normal 4 hours prior to arrival. Acute MCA infarct on CT. Taken to interventional radiology since INR was 1.9  Juliet Rude. Rubin Payor, MD 06/30/13 [phone]

## 2013-07-02 NOTE — Discharge Summary (Signed)
Patient ID: Tara [name] MRN: [id number] DOB/AGE: 1932-05-09 79 y.o.  Admit date: 07-14-13 Death date: July 16, 2013 at 0523  Admission Diagnoses: Stroke  Cause of Death:  Respiratory failure due to cerebral edema and brain herniation due to large right hemispheric infarct due to right internal carotid artery occlusion status post mechanical embolectomy  with revascularization and symptomatic hemorrhagic transformation. Patient made DO NOT RESUSCITATE and comfort care per family  Pertinent Medical Diagnosis: Principal Problem:   Acute right MCA stroke Active Problems:   Acute respiratory failure   Altered mental status   HTN (hypertension)   Cytotoxic cerebral edema   Brain herniation   Hospital Course: Tara [name] is an 79 y.o. female with a past medical history significant for atrial fibrillation on coumadin, brought to [facility] ED 07-14-2013 at 1024 with altered mental status and left hemiparesis. She was last seen normal at 620 am 07-14-2013 and was found by her daughter couple of hours later sitting in a chair, unresponsive, with her whole body drifted to the left side.  Upon arrival to ED she was still poorly responsive and vomited.  NIHSS 22. CT brain showed a hyperdense R MCA sign and early hypodense area involving approximately less than 1/3 R MCA territory.  INR 1.99  Patient was not a TPA candidate secondary to high INR 1.99. CT brain showed a hyperdense R MCA sign and early hypodense area involving approximately less than 1/3 R MCA territory. Patient was taken to the interventional radiology suite where she received complete angiograhic recanalization of occluded RT ICA,RT MCA and RT ACA using 2 passes with the trevoprovue device. She was admitted to the neuro ICU for further evaluation and treatment. Postprocedure scan unfortunately showed significant right hemispheric ischemia with mass effect and midline shift as well as hemorrhagic transformation. Patient neurological exam is  quite poor and prognosis was felt to be bad. After several discussions with the family the family felt patient would not have wanted prolonged ventilatory support and feeding tube and her life of PT in the nursing home which would have been unavoidable. The family has made her DO NOT RESUSCITATE and comfort care and ventilatory support was withdrawn. Agents condition value to decline and she was pronounced dead by the heart and on 2013-07-16 at 0523 hours  CT of the brain  July 14, 2013 Significant changes in the right cerebral hemisphere consistent with progressive ischemia. There is now significant mass effect or midline shift from right to left of approximately 11 mm. There also changes in the left frontal lobe consistent with acute ischemia this is likely related to compression from the midline shift. Diffuse increased density within the parenchyma of the right cerebral hemisphere vertically in the region of the basal ganglia likely related to staining from recent arteriogram. There is one that focus which may represent some localized hemorrhage although it is difficult to assess on the basis of this exam. Continued follow-up is recommended.  14-Jul-2013 1. Acute right MCA infarct. No corresponding acute hemorrhage.  Cerebral angio S/P RT common carotid arteriogram followed by by complete angiograhic recanalization of occluded RT ICA,RT MCA and RT ACA using 2 passes with the trevoprovue device..  Isolated rt cerebral cerebral hemisphere with no collaterals.       SignedGates Rigg 07/02/2013, 5:10 PM

## 2013-07-14 NOTE — Progress Notes (Signed)
Pt time of death @ 0523, upon auscultation heart sounds were absent, breath sounds negative, Cora Collum RN and Gaffer.  Pts daughter Burna Mortimer aware that the pt has 2 gold tone wedding rings on her left ring finger, they could not be removed due to swelling.

## 2013-07-14 DEATH — deceased

## 2015-03-06 IMAGING — CT CT HEAD W/O CM
1 series · 15 of 30 positions shown, 19 images · non-contrast
Comparison: None.

CLINICAL DATA: Code stroke.  Sudden onset left sided weakness.
Decreased responsiveness.

CT HEAD WITHOUT CONTRAST
TECHNIQUE: Contiguous axial images were obtained from the base of
the skull through the vertex without contrast.

[Series 2: head 5.0 h30s · axial · 0.46mm/px · z∈[-150,-5]mm · 15 of 33 slices shown, 19 images]
[im 2/33  brain]
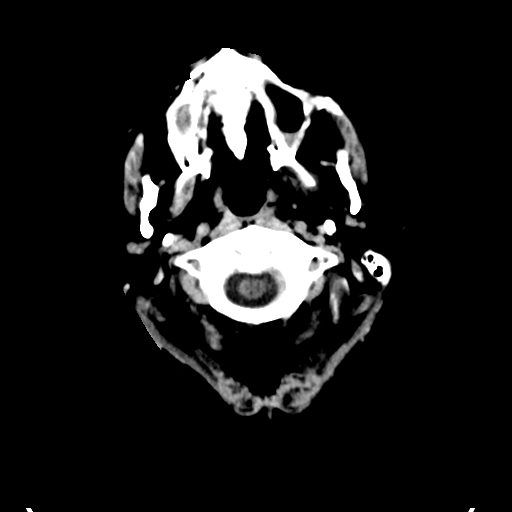
[im 2/33  bone]
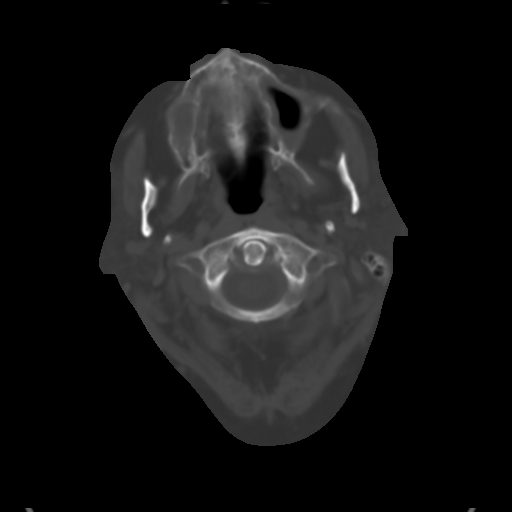
[im 4/33  brain]
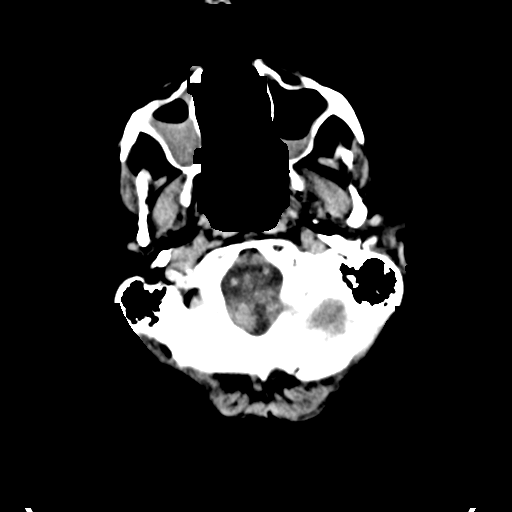
[im 6/33  brain]
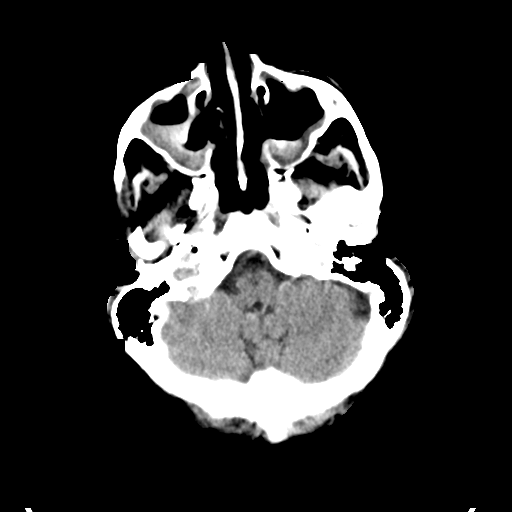
[im 8/33  brain]
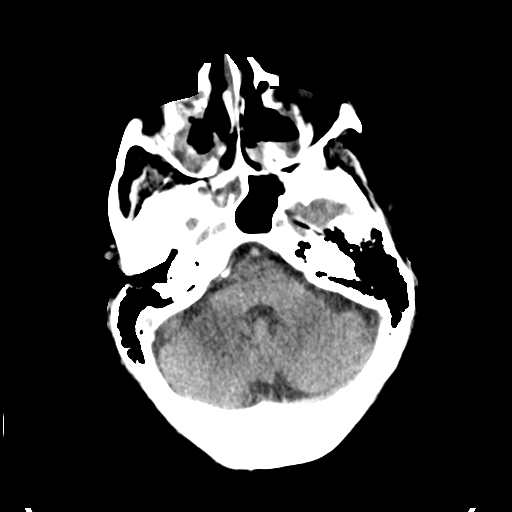
[im 10/33  brain]
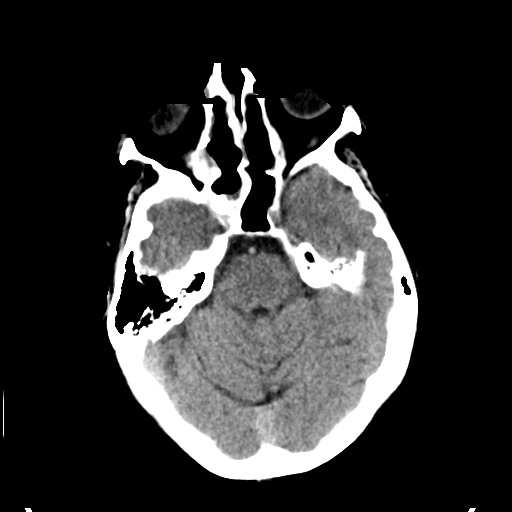
[im 10/33  bone]
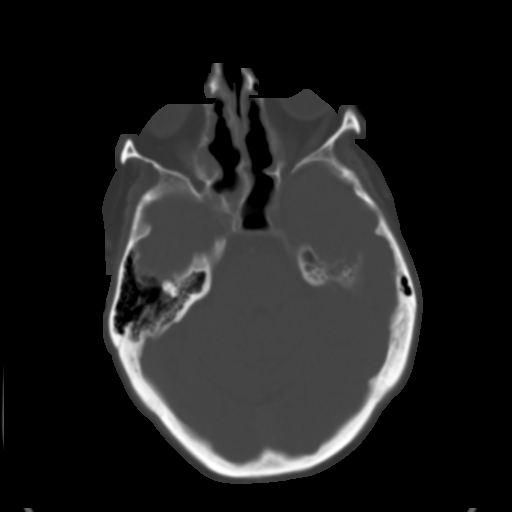
[im 13/33  brain]
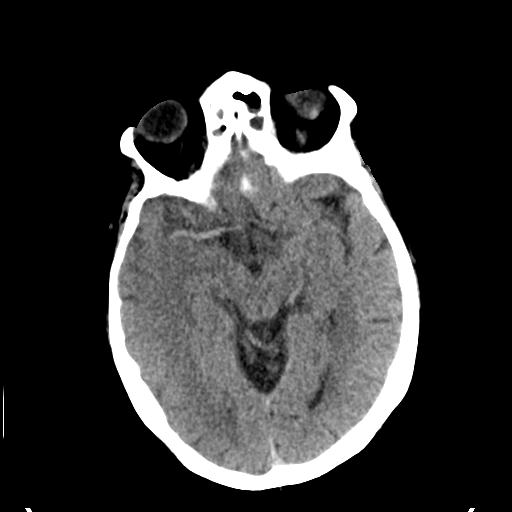
[im 15/33  brain]
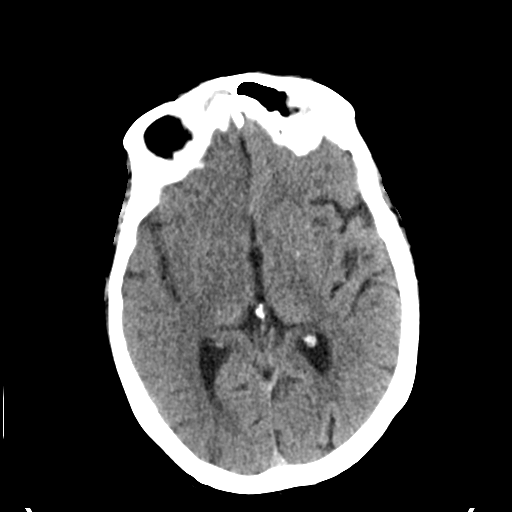
[im 17/33  brain]
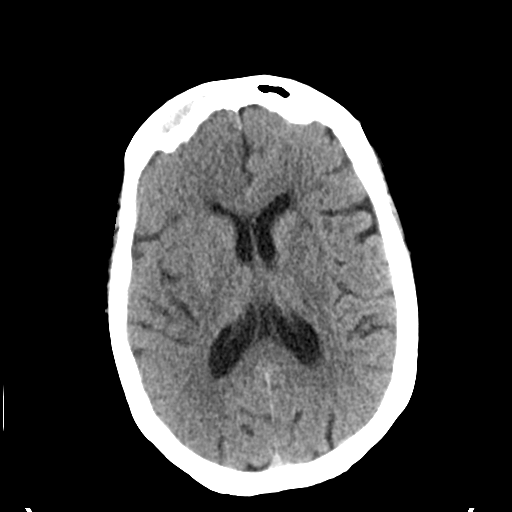
[im 18/33  brain]
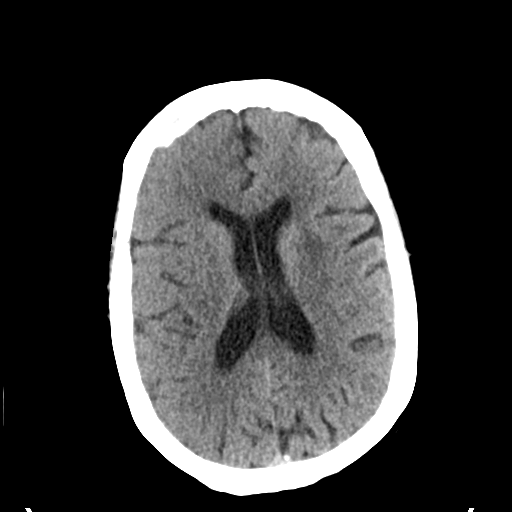
[im 18/33  bone]
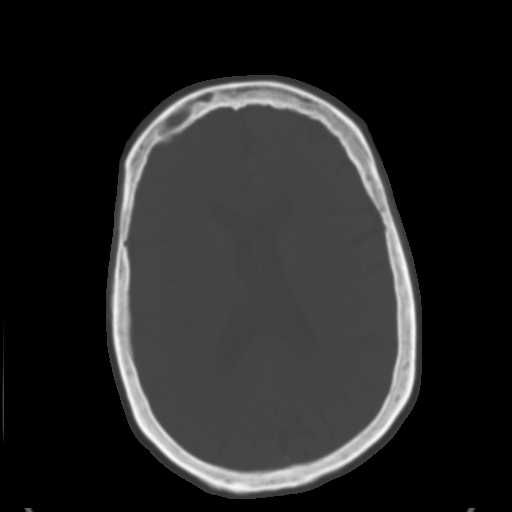
[im 20/33  brain]
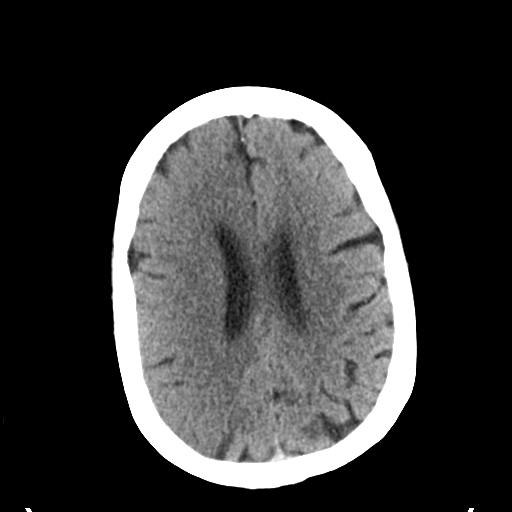
[im 23/33  brain]
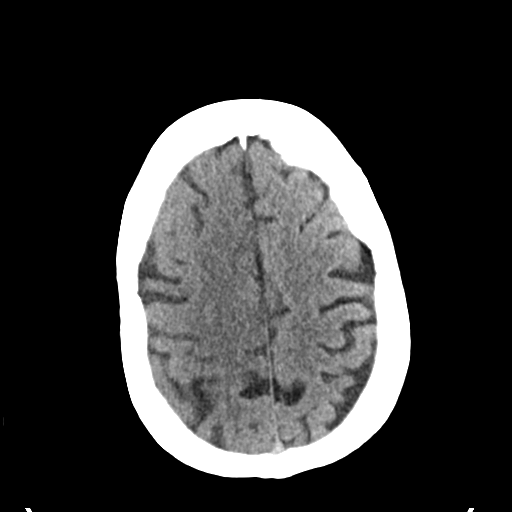
[im 25/33  brain]
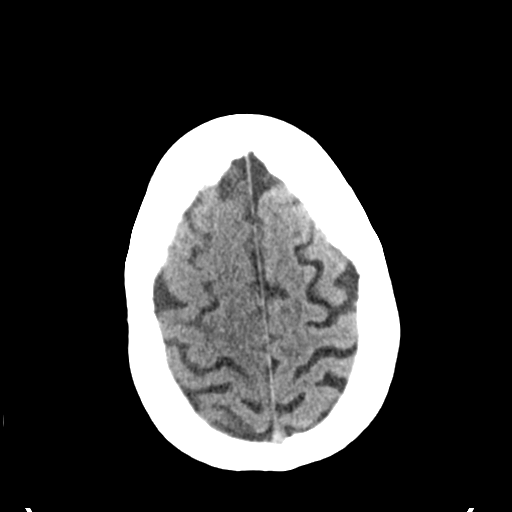
[im 27/33  brain]
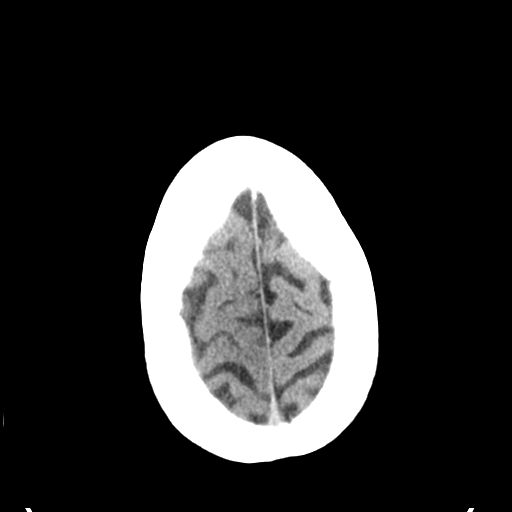
[im 27/33  bone]
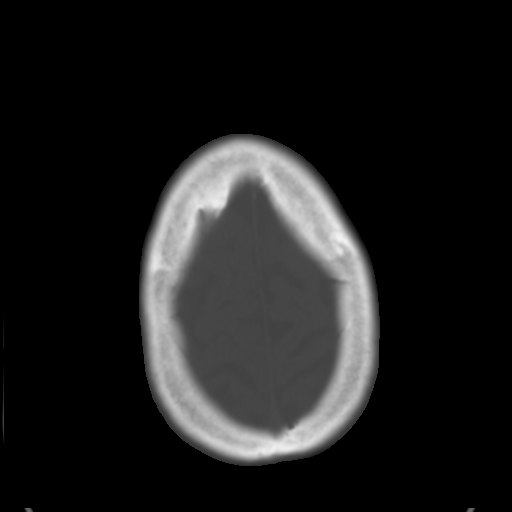
[im 29/33  brain]
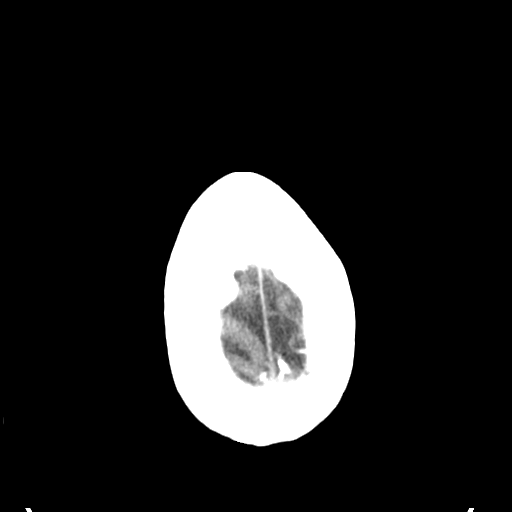
[im 31/33  brain]
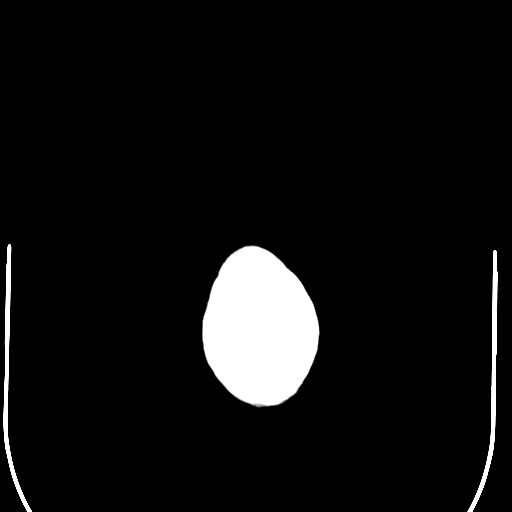

[15 of 30 positions shown; findings below may reference images not displayed]

FINDINGS: There is a hyperdense right middle cerebral artery with
some low attenuation in the adjacent brain parenchyma (images 13
and 14).   No evidence of acute hemorrhage, mass lesion, mass
effect or hydrocephalus.  Some asymmetry is seen in the cortical
and subcortical white matter of the high right frontal and parietal
lobes (example images 23-25).  There may be remote lacunar infarcts
in the right basal ganglia. Mild atrophy.  Mild periventricular low
attenuation.

Mucosal thickening and fluid are seen in the maxillary sinuses with
bilateral maxillary antrostomies.  Scattered opacification of
residual ethmoid air cells and right frontal sinus as well as right
sphenoid sinus.  Mastoid air cells are clear.
IMPRESSION: 1.  Acute right MCA infarct.  No corresponding acute hemorrhage.
Critical Value/emergent results were called by telephone at the
time of interpretation on 06/26/2013 at 0390 hours to Dr. Macaluso,
who verbally acknowledged these results.
2.  Mild atrophy and chronic microvascular white matter ischemic
changes.
3.  Extensive paranasal sinus inflammatory changes.

## 2015-03-07 NOTE — Consult Note (Signed)
PATIENT NAME:  Tara [name], Tara [name] MR#:  [id number] DATE OF BIRTH:  [birth year]  DATE OF CONSULTATION:  03/28/2012  REFERRING PHYSICIAN:  Dr. Rosita Kea CONSULTING PHYSICIAN:  Fredia Sorrow, MD  PRIMARY CARE PHYSICIAN: Dr. Bethann Punches.   REASON FOR CONSULTATION: Periop medical management.   CHIEF COMPLAINT: My legs are numb. The patient is status post left total knee arthroplasty under spinal anesthesia.   HISTORY OF PRESENT ILLNESS: A 81 year old female who has history of atrial fibrillation. She is status post cardioversion x2 and the last cardioversion was done in January 2013. She is maintained in sinus rhythm on sotalol. Also, history of diabetes, hypertension, hyperlipidemia, gout, depression and she had a negative cardiac catheterization in 2008. Also, history of obstructive sleep apnea, not tolerating CPAP mask. Today she had a left total knee arthroplasty done. She has been having difficulty walking and pain in the left knee because of significant degenerative joint disease. Postoperatively, she is mainly complaining of some numbness in her bilateral lower extremities because of spinal anesthesia. She denies any chest pain, any shortness of breath, any nausea at this time. Denies any dizziness. She says baseline she does not have any chest pains on exertion, dyspnea on exertion. She had stopped her warfarin about one week before surgery. INR today before going for surgery was 1. She denies any palpitations. She denies any bleeding from any site. No abdominal pain. Currently postoperatively, she is not experiencing any cardiac symptoms.   PAST MEDICAL HISTORY:  1. Diabetes. 2. Hypertension. 3. Hyperlipidemia. 4. Gout. 5. Depression. 6. Obesity. 7. Peptic ulcer disease.  8. Atrial fibrillation status post cardioversion x2 last done in January 2013 and she is on Betapace right now. Obstructive sleep apnea, unable to tolerate mask.  9. History of anal fissure with rectal bleeding. 10. History  of cardiac catheterization in 2008 revealing significant coronary disease with preserved LV function.   PAST SURGICAL HISTORY:  1. Appendectomy.  2. Hysterectomy. 3. Cholecystectomy. 4. Vein stripping. 5. Bilateral bunion surgery. 6. Fracture of the left ankle with screw and plate.  7. Hammertoe surgery.  8. Left carpal tunnel release.    ALLERGIES TO MEDICATIONS: Celebrex, cephalexin, codeine, colchicine, sulfa, Vicodin, Vioxx, amitriptyline.   HOME MEDICATIONS:  1. Allopurinol 100 mg daily. 2. Amlodipine 5 mg daily.  3. Calcium with vitamin D twice a day. 4. Centrum Silver daily.  5. Clonidine 0.1 mg b.i.d.  6. Fenofibrate 160 mg daily.  7. Fish oil 1,000 milligrams 3 times a day.  8. Hydrochlorothiazide 25 mg daily.  9. Hydrochlorothiazide 12.5 mg daily. 10. Losartan 100 mg daily. 11. Melatonin 5 mg at bedtime. 12. Metformin 500 mg b.i.d.  13. Protonix 40 mg b.i.d.  14. Simvastatin 40 mg at bedtime. 15. Sotalol 80 mg b.i.d.  16. Venlafaxine 75 mg daily.  17. Vitamin B12 1,000 mcg twice a day. 18. Vitamin D3 2,000 units twice a day.  19. Warfarin 4 mg daily.   SOCIAL HISTORY: She lives with her husband. No smoking or alcohol use.   FAMILY HISTORY: She says her mother had myocardial infarction. Also, her brother had heart problems.   REVIEW OF SYSTEMS: CONSTITUTIONAL: She denies any fever or weakness. HEENT: No acute change in vision. No headache. No dizziness. RESPIRATORY: No cough. No dyspnea. CARDIOVASCULAR: No chest pain. No palpitations. GASTROINTESTINAL: No nausea, vomiting, abdominal pain. GENITOURINARY: No urinary complaints right now. She has a Foley catheter in. ENDOCRINE: No thyroid problems. HEMATOLOGIC: No anemia. INTEGUMENT: No rash. MUSCULOSKELETAL: She mainly has left knee  pain. NEUROLOGIC: No focal numbness or weakness. PSYCHIATRIC: She has history of depression.   PHYSICAL EXAMINATION:  VITAL SIGNS: Temperature 97.5, heart rate of 53, blood pressure  122/61, sating 95% on two liters nasal cannula.   GENERAL: This is an elderly Caucasian female, obese, comfortably lying in bed supine, no acute distress. She looks pale.   HEENT: Bilateral pupils are equal. Extraocular muscles intact. No scleral icterus. No conjunctivitis. Oral mucosa slightly pale, moist.   NECK: No thyroid tenderness, enlargement or nodule. Neck is supple. No masses, nontender. No adenopathy. No JVD. No carotid bruit.   CHEST: Bilateral breath sounds are clear. No wheeze. Normal effort. No respiratory distress.   HEART: Heart sounds are regular but appear distant. No murmur. Peripheral pulses 1+. No lower extremity edema.   ABDOMEN: Soft, nontender. Normal bowel sounds. No hepatomegaly. No bruit. No masses.   RECTAL: Deferred.   NEUROLOGIC: She is awake, alert, oriented to time, place, and person. Cranial nerves are intact. Her lower extremities are numb right now. She cannot move her lower extremities because of spinal anesthesia. She can move her bilateral upper extremities.   SKIN: No rash. No lesions. Her left knee is immobilized at this time.   LABORATORY, RADIOLOGICAL AND DIAGNOSTIC DATA: White count of 7, hemoglobin 14.5, platelet count of 193,000 on 03/19/2012. BMP: Sodium 141, potassium 3.8, BUN 29, creatinine 0.93. INR of l on 03/28/2012. Her last EKG on the chart actually showed sinus bradycardia at 55.   IMPRESSION:  1. Paroxysmal atrial fibrillation status post cardioversion, now in sinus rhythm, maintained on sotalol. 2. Diabetes. 3. Hypertension. 4. Hyperlipidemia. 5. Depression. 6. History of negative cardiac catheterization in the past.  7. Obstructive sleep apnea, not tolerating CPAP. 8. History of peptic ulcer disease status post left total knee arthroplasty.   PLAN: A 81 year old female who has history of diabetes, hypertension, hyperlipidemia and atrial fib with previous cardioversion, obstructive sleep apnea. She is on sotalol for her atrial  fibrillation, which will be continued. Her Coumadin has been restarted now at 10 mg daily. PT-INR should be monitored. She is on metformin for diabetes. Currently, she is on hydrochlorothiazide, Losartan and clonidine for hypertension. She is on a total of 37.5 mg of hydrochlorothiazide which probably is making her BUN high, so I am going to just continue 25 mg of hydrochlorothiazide and discontinue the extra 12.5. Continue Losartan because the patient has some microalbuminuria. The patient is bradycardic right now. This needs to be monitored. Both clonidine and sotalol would cause bradycardia. Clonidine can be cut down if the bradycardia persists. She is on simvastatin for hyperlipidemia. Currently, the patient does not have any cardiac symptoms at this time. Her blood pressure is well controlled. Continue to monitor heart rate. The patient will be followed by Dr. Bethann PunchesMark Miller during the hospital course.   TIME SPENT IN CONSULTATION: 45 minutes.   ____________________________ Fredia SorrowAbhinav Quintell Bonnin, MD ag:ap D: 03/28/2012 15:03:47 ET           T: 03/29/2012 07:37:55 ET                    JOB#: [id number] cc: Fredia SorrowAbhinav Catina Nuss, MD, <Dictator> Leitha SchullerMichael J. Menz, MD Danella PentonMark F. Miller, MD Fredia SorrowABHINAV Quincy Boy MD ELECTRONICALLY SIGNED 04/27/2012 17:45

## 2015-03-07 NOTE — Discharge Summary (Signed)
PATIENT NAME:  Tara [name], Maymie B MR#:  [id number] DATE OF BIRTH:  Jun 20, 1932  DATE OF ADMISSION:  03/28/2012 DATE OF DISCHARGE:  04/02/2012  ADMITTING DIAGNOSIS: Left knee osteoarthritis.   DISCHARGE DIAGNOSIS: Left knee osteoarthritis.   PROCEDURE: Left total knee replacement on 03/28/2012.   ANESTHESIA: Spinal.   SURGEON: Leitha SchullerMichael J. Menz, M.D.   ASSISTANT: Cranston Neighborhris Gaines, PA-C.   ESTIMATED BLOOD LOSS: 50 mL.   TOURNIQUET TIME: 65 minutes at 300 mmHg.   IMPLANTS: Medacta GMK primary 3 left fixed tibial tray with a 14 mm size 3 UC tibial insert, a size 4n femoral component and a size 1 patella resurfacing component all for the left knee. Antibiotic cement was utilized.   CONDITION: To recovery room stable.   SPECIMEN: Cut ends of bone.   NOTE: The patient was stabilized, brought to the recovery room and then brought down to the orthopedic floor where she was treated with physical therapy and pain control.   HISTORY: Patient is a 81 year old who [facility]. She has exhausted nonoperative treatment with injections, corticosteroid and Synvisc without relief. She had peptic ulcer disease, atrial fibrillation is on Coumadin so cannot be on anti-inflammatory medications. She has gotten to the point where she is having trouble walking. She would like to stay more active but her left knee is limiting her. She comes in after having MyKnee CT confirming significant osteoarthritis.   PHYSICAL EXAMINATION: GENERAL: Well-developed, well-nourished female, no apparent distress. Normal affect. Mild antalgic gait in left lower extremity. HEENT: Head normocephalic, atraumatic. Eyes: Pupils equal, round, reactive to light and accommodation. Everything else is unremarkable. LUNGS: Clear to auscultation. HEART: Irregular rate and rhythm. LEFT LOWER EXTREMITY: On exam she has range of motion 0-100 degrees. There is crepitation anteriorly and medially. There is moderate swelling and  a positive Baker's cyst. She has trace DP pulses, absent posterior tibialis pulse. Some venous stasis changes from mid calf down. She has mild edema in both lower extremities.   HOSPITAL COURSE: After initial admission on 03/28/2012 the patient had surgery that same day. The patient had some moderate pain after surgery on the orthopedic floor. Her oxycodone was discontinued and she was started on Nucynta and Ultram. Patient's pain improved. Patient's atrial fibrillation was controlled with Coumadin and INR was elevated initially but brought back down within normal limits on 05/21. Patient did develop some mild hypokalemia but was able to get potassium normal with oral supplementation. Patient's labs remained stable throughout her stay. The patient received no transfusion. On 04/02/2012 the patient was stable and ready to go to rehab on 04/02/2012.   CONDITION AT DISCHARGE: Stable.   DISPOSITION: The patient was sent to rehab.   DISCHARGE INSTRUCTIONS:  1. Patient may gradually increase weight bearing on the affected extremity. She may keep the leg elevated on 1 or 2 pillows. She is to continue wearing knee-high TED hose on both legs and remove at bedtime and then replace the next morning.  2. She can resume a regular diet as tolerated.  3. She is to continue her Coumadin with INR checks.  4. For pain she is to take Ultram and Nucynta.  5. She should continue the Polar Care unit maintaining temperature between 40 and 50 degrees Fahrenheit. 6. She is not to get the dressing wet or dirty.  7. She is to call [facility] if she develops any bright red bleeding from the incision site and she should also call [facility] for a follow-up  appointment in 10 days.    DISCHARGE MEDICATIONS:  1. Pantoprazole 40 mg oral delayed release tablet 1 tab orally 2 times a day.  2. Simvastatin 40 mg oral tablet 1 tablet orally once a day. 3. Sotalol 80 mg oral tablet 1 tablet orally 2 times a  day. 4. Metformin 500 mg oral tablet 1 tablet orally 2 times a day. 5. HCTZ 12.5/100 mg oral tablet 1 tablet orally once a day in the morning.  6. Venlafaxine 75 mg oral capsule extended release 1 tablet orally once a day. 7. Hydrochlorothiazide 25 mg oral tablet 1 tablet orally once a day. 8. Centrum Silver oral tablet 1 tablet orally once a day. 9. Fish oil 1000 mg oral capsule one cap orally 3 times a day.  10. Vitamin D3 1000 international units oral tablet 2 tabs orally 2 times a day. 11. Vitamin B12 1000 mcg oral tablet 1 tablet orally 2 times a day. 12. Amlodipine 5 mg oral tablet 1 tablet orally once a day in the morning. 13. Allopurinol 100 mg oral tablet 1 tablet orally once a day.  14. Fenofibrate 160 mg oral tablet 1 tablet orally once a day in the morning.  15. Melatonin 5 mg oral tablet 1 tablet orally once a day at bedtime. 16. Calcium 600 plus D oral tablet 1 tablet orally 2 times a day. 17. Calcium 600 oral tablet 1 tablet orally 2 times a day. 18. Warfarin 4 mg oral tablet 1 tablet orally once a day. 19. Clonidine 0.2 mg oral tablet half tablet orally 2 times a day.  ____________________________ Evon Slack, PA-C tcg:cms D: 04/02/2012 08:23:29 ET T: 04/02/2012 11:44:49 ET JOB#: [id number]  cc: Evon Slack, PA-C, <Dictator> Evon Slack [place] ELECTRONICALLY SIGNED 04/09/2012 12:30

## 2015-03-07 NOTE — Op Note (Signed)
PATIENT NAME:  Tara [name], Tara [name] MR#:  [id number] DATE OF BIRTH:  Jan 15, 1932  DATE OF PROCEDURE:  03/28/2012  PREOPERATIVE DIAGNOSIS: Left knee osteoarthritis.   POSTOPERATIVE DIAGNOSIS: Left knee osteoarthritis.   PROCEDURE: Left total knee replacement.   ANESTHESIA: Spinal.   SURGEON: Leitha SchullerMichael J. Viriginia Amendola, MD  ASSISTANT: Cranston Neighborhris Gaines PA-C.   DESCRIPTION OF PROCEDURE: Patient was brought to the Operating Room and after adequate spinal anesthesia was obtained the left leg was prepped and draped in the usual sterile fashion with a tourniquet applied to the upper thigh. After appropriate patient identification and timeout procedures were completed, the leg was exsanguinated with an Esmarch and the tourniquet raised to 300 mmHg. Anterior midline skin incision was made with the knee in flexion followed by a medial parapatellar arthrotomy. Inspection revealed significant tricompartmental osteoarthritis with some sclerotic bone. The anterior cruciate ligament appeared deficient. The anterior horns of the menisci were excised along with the fat pad. The medial capsule was elevated for application of the Medacta tibial cutting guide. When this was applied and alignment checked, proximal tibia cut was carried out with removal of that bone and the bone cuts matched the model. Next, going to the femur the bone block again was applied checking to make sure it was in appropriate position. Distal drill holes were made along with the distal femoral cut. With the distal femoral pin holes pins were placed and the size 4 chamfer cutting guide was applied. Anterior, posterior, and chamfer cuts carried out without notching. Trial components were placed with the 3 tibia and a 4 femur with a 14 mm insert giving excellent stability and was chosen as the final component. The V-cut was made in the proximal tibia for this along with the notch cut on the femur and distal drill holes for the femoral component. The patella was cut  with a freehand technique and it is sized to a size 1 with drill holes placed. Will trial components placed, there was full extension without difficulty, flexion to approximately 110 with posterior thigh soft tissues and calf soft tissues limiting further flexion. All trial components were removed at this point and the bony surfaces thoroughly dried and irrigated. The tibial component was cemented into place first followed by placement of the tibial insert 14 mm UC implant followed by the 4 femoral component. A size 1 patellar button was clamped into place and after excess cement was removed the knee was held in extension as the cement set.  Following this, the excess cement was checked and removed after being completely set. Again range of motion was good and patellofemoral tracking was normal with the no touch technique. The capsule was then infiltrated with a combination of 10 mg morphine, 30 mL 0.25% Sensorcaine with epinephrine to the posterior capsule and along the arthrotomy. The tourniquet was let down. Hemostasis checked with electrocautery. The arthrotomy repaired using a heavy quill suture followed by 2-0 quill subcutaneously and skin staples. Xeroform, 4 x 4's, ABD, Webril, Polar Care, and Ace wrap were applied along with a knee immobilizer. Patient was sent to recovery in stable condition.   ESTIMATED BLOOD LOSS: 50 mL.   TOURNIQUET TIME: 65 minutes at 300 mmHg.   IMPLANTS: Medacta GMK primary 3 left fixed tibial tray with a 14 mm size 3 UC tibial insert, a size 4N femoral component and a size 1 patellar resurfacing component; all for the left knee. Antibiotic cement was utilized.   CONDITION: To recovery room stable.   SPECIMEN: Cut  ends of bone.   ____________________________ Leitha Schuller, MD mjm:cms D: 03/28/2012 22:02:52 ET T: 03/29/2012 10:03:36 ET JOB#: [id number]  cc: Leitha Schuller, MD, <Dictator> Leitha Schuller MD ELECTRONICALLY SIGNED 03/29/2012 12:56
